# Patient Record
Sex: Female | Born: 1985 | Race: White | Hispanic: No | Marital: Single | Smoking: Never smoker
Health system: Southern US, Community
[De-identification: ages and names within clinical notes are randomized; demographics above are authoritative.]

## PROBLEM LIST (undated history)

## (undated) ENCOUNTER — Inpatient Hospital Stay (HOSPITAL_COMMUNITY): Payer: Self-pay

## (undated) DIAGNOSIS — N189 Chronic kidney disease, unspecified: Secondary | ICD-10-CM

## (undated) DIAGNOSIS — N200 Calculus of kidney: Secondary | ICD-10-CM

## (undated) DIAGNOSIS — E119 Type 2 diabetes mellitus without complications: Secondary | ICD-10-CM

## (undated) DIAGNOSIS — K8020 Calculus of gallbladder without cholecystitis without obstruction: Secondary | ICD-10-CM

## (undated) HISTORY — DX: Type 2 diabetes mellitus without complications: E11.9

## (undated) HISTORY — PX: OTHER SURGICAL HISTORY: SHX169

## (undated) HISTORY — PX: URETERAL STENT PLACEMENT: SHX822

---
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
## 2007-06-26 ENCOUNTER — Encounter: Payer: Self-pay | Admitting: Obstetrics and Gynecology

## 2007-06-26 ENCOUNTER — Ambulatory Visit (HOSPITAL_COMMUNITY): Admission: RE | Admit: 2007-06-26 | Discharge: 2007-06-26 | Payer: Self-pay | Admitting: Obstetrics & Gynecology

## 2007-06-26 ENCOUNTER — Ambulatory Visit: Payer: Self-pay | Admitting: Obstetrics & Gynecology

## 2007-07-03 ENCOUNTER — Ambulatory Visit: Payer: Self-pay | Admitting: Obstetrics & Gynecology

## 2007-07-05 ENCOUNTER — Ambulatory Visit: Payer: Self-pay | Admitting: Gynecology

## 2007-07-06 ENCOUNTER — Inpatient Hospital Stay (HOSPITAL_COMMUNITY): Admission: AD | Admit: 2007-07-06 | Discharge: 2007-07-09 | Payer: Self-pay | Admitting: Gynecology

## 2007-07-06 ENCOUNTER — Ambulatory Visit: Payer: Self-pay | Admitting: Obstetrics and Gynecology

## 2008-11-02 ENCOUNTER — Emergency Department (HOSPITAL_COMMUNITY): Admission: EM | Admit: 2008-11-02 | Discharge: 2008-11-02 | Payer: Self-pay | Admitting: Family Medicine

## 2009-03-18 ENCOUNTER — Inpatient Hospital Stay (HOSPITAL_COMMUNITY): Admission: AD | Admit: 2009-03-18 | Discharge: 2009-03-18 | Payer: Self-pay | Admitting: Family Medicine

## 2009-03-18 ENCOUNTER — Encounter: Payer: Self-pay | Admitting: Family Medicine

## 2009-03-31 ENCOUNTER — Ambulatory Visit: Payer: Self-pay | Admitting: Obstetrics and Gynecology

## 2009-03-31 LAB — CONVERTED CEMR LAB: Antibody Screen: NEGATIVE

## 2009-04-01 ENCOUNTER — Encounter: Payer: Self-pay | Admitting: Obstetrics and Gynecology

## 2009-04-07 ENCOUNTER — Ambulatory Visit: Payer: Self-pay | Admitting: Obstetrics and Gynecology

## 2009-04-07 LAB — CONVERTED CEMR LAB
Chlamydia, DNA Probe: POSITIVE — AB
GC Probe Amp, Genital: NEGATIVE

## 2009-04-08 ENCOUNTER — Ambulatory Visit: Payer: Self-pay | Admitting: Obstetrics & Gynecology

## 2009-04-14 ENCOUNTER — Encounter: Payer: Self-pay | Admitting: Obstetrics and Gynecology

## 2009-04-14 ENCOUNTER — Ambulatory Visit: Payer: Self-pay | Admitting: Obstetrics & Gynecology

## 2009-04-14 LAB — CONVERTED CEMR LAB
Chlamydia, DNA Probe: NEGATIVE
GC Probe Amp, Genital: NEGATIVE

## 2009-04-15 ENCOUNTER — Encounter: Payer: Self-pay | Admitting: Obstetrics and Gynecology

## 2009-04-16 ENCOUNTER — Ambulatory Visit: Payer: Self-pay | Admitting: Obstetrics & Gynecology

## 2009-04-21 ENCOUNTER — Ambulatory Visit (HOSPITAL_COMMUNITY): Admission: RE | Admit: 2009-04-21 | Discharge: 2009-04-21 | Payer: Self-pay | Admitting: Obstetrics and Gynecology

## 2009-04-21 ENCOUNTER — Ambulatory Visit: Payer: Self-pay | Admitting: Obstetrics and Gynecology

## 2009-04-28 ENCOUNTER — Ambulatory Visit: Payer: Self-pay | Admitting: Obstetrics and Gynecology

## 2009-04-30 ENCOUNTER — Ambulatory Visit: Payer: Self-pay | Admitting: Obstetrics and Gynecology

## 2009-05-02 ENCOUNTER — Inpatient Hospital Stay (HOSPITAL_COMMUNITY): Admission: RE | Admit: 2009-05-02 | Discharge: 2009-05-05 | Payer: Self-pay | Admitting: Obstetrics & Gynecology

## 2009-05-02 ENCOUNTER — Ambulatory Visit: Payer: Self-pay | Admitting: Family Medicine

## 2009-09-05 ENCOUNTER — Observation Stay (HOSPITAL_COMMUNITY): Admission: EM | Admit: 2009-09-05 | Discharge: 2009-09-05 | Payer: Self-pay | Admitting: Emergency Medicine

## 2009-09-16 ENCOUNTER — Emergency Department (HOSPITAL_COMMUNITY): Admission: EM | Admit: 2009-09-16 | Discharge: 2009-09-17 | Payer: Self-pay | Admitting: Emergency Medicine

## 2009-10-13 ENCOUNTER — Emergency Department (HOSPITAL_COMMUNITY): Admission: EM | Admit: 2009-10-13 | Discharge: 2009-10-13 | Payer: Self-pay | Admitting: Family Medicine

## 2010-06-04 LAB — URINE CULTURE
Colony Count: >=100000
Culture  Setup Time: 201107272020

## 2010-06-04 LAB — POCT URINALYSIS DIP (DEVICE)
Bilirubin Urine: NEGATIVE
Glucose, UA: NEGATIVE mg/dL
Ketones, ur: NEGATIVE mg/dL
Nitrite: POSITIVE — AB
Protein, ur: 100 mg/dL — AB
Specific Gravity, Urine: 1.025 (ref 1.005–1.030)
Urobilinogen, UA: 0.2 mg/dL (ref 0.0–1.0)
pH: 5.5 (ref 5.0–8.0)

## 2010-06-04 LAB — POCT PREGNANCY, URINE: Preg Test, Ur: NEGATIVE

## 2010-06-05 LAB — URINALYSIS, ROUTINE W REFLEX MICROSCOPIC
Bilirubin Urine: NEGATIVE
Bilirubin Urine: NEGATIVE
Glucose, UA: NEGATIVE mg/dL
Glucose, UA: NEGATIVE mg/dL
Hgb urine dipstick: NEGATIVE
Ketones, ur: NEGATIVE mg/dL
Ketones, ur: NEGATIVE mg/dL
Nitrite: NEGATIVE
Nitrite: POSITIVE — AB
Protein, ur: 30 mg/dL — AB
Protein, ur: NEGATIVE mg/dL
Specific Gravity, Urine: 1.019 (ref 1.005–1.030)
Specific Gravity, Urine: 1.004 — ABNORMAL LOW (ref 1.005–1.030)
Urobilinogen, UA: 0.2 mg/dL (ref 0.0–1.0)
Urobilinogen, UA: 0.2 mg/dL (ref 0.0–1.0)
pH: 8 (ref 5.0–8.0)
pH: 6.5 (ref 5.0–8.0)

## 2010-06-05 LAB — DIFFERENTIAL
Basophils Absolute: 0 10*3/uL (ref 0.0–0.1)
Basophils Relative: 0 % (ref 0–1)
Eosinophils Absolute: 0 10*3/uL (ref 0.0–0.7)
Eosinophils Relative: 0 % (ref 0–5)
Lymphocytes Relative: 8 % — ABNORMAL LOW (ref 12–46)
Lymphs Abs: 1.2 10*3/uL (ref 0.7–4.0)
Monocytes Absolute: 0.6 10*3/uL (ref 0.1–1.0)
Monocytes Relative: 4 % (ref 3–12)
Neutro Abs: 13.3 10*3/uL — ABNORMAL HIGH (ref 1.7–7.7)
Neutrophils Relative %: 88 % — ABNORMAL HIGH (ref 43–77)

## 2010-06-05 LAB — COMPREHENSIVE METABOLIC PANEL
ALT: 27 U/L (ref 0–35)
AST: 22 U/L (ref 0–37)
Albumin: 4 g/dL (ref 3.5–5.2)
Alkaline Phosphatase: 47 U/L (ref 39–117)
BUN: 11 mg/dL (ref 6–23)
CO2: 24 mEq/L (ref 19–32)
Calcium: 9.2 mg/dL (ref 8.4–10.5)
Chloride: 108 mEq/L (ref 96–112)
Creatinine, Ser: 1.23 mg/dL — ABNORMAL HIGH (ref 0.4–1.2)
GFR calc Af Amer: >60 mL/min (ref >60)
GFR calc non Af Amer: 55 mL/min — ABNORMAL LOW (ref >60)
Glucose, Bld: 132 mg/dL — ABNORMAL HIGH (ref 70–99)
Potassium: 3.6 mEq/L (ref 3.5–5.1)
Sodium: 140 mEq/L (ref 135–145)
Total Bilirubin: 0.4 mg/dL (ref 0.3–1.2)
Total Protein: 7.1 g/dL (ref 6.0–8.3)

## 2010-06-05 LAB — URINE CULTURE: Colony Count: >=100000

## 2010-06-05 LAB — CBC
HCT: 39.2 % (ref 36.0–46.0)
Hemoglobin: 13.2 g/dL (ref 12.0–15.0)
MCHC: 33.8 g/dL (ref 30.0–36.0)
MCV: 84.6 fL (ref 78.0–100.0)
Platelets: 209 10*3/uL (ref 150–400)
RBC: 4.63 MIL/uL (ref 3.87–5.11)
RDW: 13.9 % (ref 11.5–15.5)
WBC: 15.2 10*3/uL — ABNORMAL HIGH (ref 4.0–10.5)

## 2010-06-05 LAB — POCT URINALYSIS DIP (DEVICE)
Bilirubin Urine: NEGATIVE
Bilirubin Urine: NEGATIVE
Bilirubin Urine: NEGATIVE
Glucose, UA: NEGATIVE mg/dL
Glucose, UA: NEGATIVE mg/dL
Glucose, UA: NEGATIVE mg/dL
Ketones, ur: NEGATIVE mg/dL
Ketones, ur: NEGATIVE mg/dL
Ketones, ur: NEGATIVE mg/dL
Nitrite: POSITIVE — AB
Nitrite: NEGATIVE
Nitrite: NEGATIVE
Protein, ur: NEGATIVE mg/dL
Protein, ur: NEGATIVE mg/dL
Protein, ur: NEGATIVE mg/dL
Specific Gravity, Urine: 1.02 (ref 1.005–1.030)
Specific Gravity, Urine: 1.015 (ref 1.005–1.030)
Specific Gravity, Urine: 1.015 (ref 1.005–1.030)
Urobilinogen, UA: 0.2 mg/dL (ref 0.0–1.0)
Urobilinogen, UA: 0.2 mg/dL (ref 0.0–1.0)
Urobilinogen, UA: 0.2 mg/dL (ref 0.0–1.0)
pH: 6 (ref 5.0–8.0)
pH: 6.5 (ref 5.0–8.0)
pH: 6.5 (ref 5.0–8.0)

## 2010-06-05 LAB — POCT PREGNANCY, URINE: Preg Test, Ur: NEGATIVE

## 2010-06-05 LAB — URINE MICROSCOPIC-ADD ON

## 2010-06-05 LAB — LIPASE, BLOOD: Lipase: 37 U/L (ref 11–59)

## 2010-06-05 LAB — PREGNANCY, URINE: Preg Test, Ur: NEGATIVE

## 2010-06-08 ENCOUNTER — Inpatient Hospital Stay (INDEPENDENT_AMBULATORY_CARE_PROVIDER_SITE_OTHER)
Admission: RE | Admit: 2010-06-08 | Discharge: 2010-06-08 | Disposition: A | Discharge status: Home / Self Care | Payer: Medicaid Other | Source: Ambulatory Visit | Attending: Family Medicine | Admitting: Family Medicine

## 2010-06-08 DIAGNOSIS — M545 Low back pain, unspecified: Secondary | ICD-10-CM

## 2010-06-08 DIAGNOSIS — N390 Urinary tract infection, site not specified: Secondary | ICD-10-CM

## 2010-06-08 LAB — CBC
HCT: 30.8 % — ABNORMAL LOW (ref 36.0–46.0)
HCT: 34.2 % — ABNORMAL LOW (ref 36.0–46.0)
Hemoglobin: 10.3 g/dL — ABNORMAL LOW (ref 12.0–15.0)
Hemoglobin: 11.3 g/dL — ABNORMAL LOW (ref 12.0–15.0)
MCHC: 32.9 g/dL (ref 30.0–36.0)
MCHC: 33.3 g/dL (ref 30.0–36.0)
MCV: 82.7 fL (ref 78.0–100.0)
MCV: 83.5 fL (ref 78.0–100.0)
Platelets: 152 10*3/uL (ref 150–400)
Platelets: 182 10*3/uL (ref 150–400)
RBC: 3.69 MIL/uL — ABNORMAL LOW (ref 3.87–5.11)
RBC: 4.13 MIL/uL (ref 3.87–5.11)
RDW: 15.4 % (ref 11.5–15.5)
RDW: 15.5 % (ref 11.5–15.5)
WBC: 8.3 10*3/uL (ref 4.0–10.5)
WBC: 9.1 10*3/uL (ref 4.0–10.5)

## 2010-06-08 LAB — POCT URINALYSIS DIP (DEVICE)
Bilirubin Urine: NEGATIVE
Bilirubin Urine: NEGATIVE
Bilirubin Urine: NEGATIVE
Glucose, UA: NEGATIVE mg/dL
Glucose, UA: NEGATIVE mg/dL
Glucose, UA: NEGATIVE mg/dL
Ketones, ur: NEGATIVE mg/dL
Ketones, ur: NEGATIVE mg/dL
Ketones, ur: NEGATIVE mg/dL
Nitrite: NEGATIVE
Nitrite: NEGATIVE
Nitrite: NEGATIVE
Protein, ur: NEGATIVE mg/dL
Protein, ur: NEGATIVE mg/dL
Protein, ur: 100 mg/dL — AB
Specific Gravity, Urine: 1.01 (ref 1.005–1.030)
Specific Gravity, Urine: 1.015 (ref 1.005–1.030)
Specific Gravity, Urine: 1.02 (ref 1.005–1.030)
Urobilinogen, UA: 0.2 mg/dL (ref 0.0–1.0)
Urobilinogen, UA: 1 mg/dL (ref 0.0–1.0)
Urobilinogen, UA: 0.2 mg/dL (ref 0.0–1.0)
pH: 6 (ref 5.0–8.0)
pH: 6.5 (ref 5.0–8.0)
pH: 7 (ref 5.0–8.0)

## 2010-06-08 LAB — CCBB MATERNAL DONOR DRAW

## 2010-06-08 LAB — GLUCOSE, CAPILLARY: Glucose-Capillary: 84 mg/dL (ref 70–99)

## 2010-06-08 LAB — POCT PREGNANCY, URINE: Preg Test, Ur: NEGATIVE

## 2010-06-08 LAB — RPR: RPR Ser Ql: NONREACTIVE

## 2010-06-09 LAB — URINE CULTURE
Colony Count: 1000
Culture  Setup Time: 201203212212

## 2010-06-20 LAB — CBC
HCT: 33.1 % — ABNORMAL LOW (ref 36.0–46.0)
Hemoglobin: 11.1 g/dL — ABNORMAL LOW (ref 12.0–15.0)
MCHC: 33.5 g/dL (ref 30.0–36.0)
MCV: 84.1 fL (ref 78.0–100.0)
Platelets: 210 10*3/uL (ref 150–400)
RBC: 3.94 MIL/uL (ref 3.87–5.11)
RDW: 13.7 % (ref 11.5–15.5)
WBC: 9.7 10*3/uL (ref 4.0–10.5)

## 2010-06-20 LAB — DIFFERENTIAL
Basophils Absolute: 0 10*3/uL (ref 0.0–0.1)
Basophils Relative: 0 % (ref 0–1)
Eosinophils Absolute: 0.4 10*3/uL (ref 0.0–0.7)
Eosinophils Relative: 4 % (ref 0–5)
Lymphocytes Relative: 18 % (ref 12–46)
Lymphs Abs: 1.8 10*3/uL (ref 0.7–4.0)
Monocytes Absolute: 0.5 10*3/uL (ref 0.1–1.0)
Monocytes Relative: 6 % (ref 3–12)
Neutro Abs: 7 10*3/uL (ref 1.7–7.7)
Neutrophils Relative %: 72 % (ref 43–77)

## 2010-06-20 LAB — URINALYSIS, ROUTINE W REFLEX MICROSCOPIC
Bilirubin Urine: NEGATIVE
Glucose, UA: NEGATIVE mg/dL
Ketones, ur: 15 mg/dL — AB
Nitrite: POSITIVE — AB
Protein, ur: 30 mg/dL — AB
Specific Gravity, Urine: 1.02 (ref 1.005–1.030)
Urobilinogen, UA: 1 mg/dL (ref 0.0–1.0)
pH: 6.5 (ref 5.0–8.0)

## 2010-06-20 LAB — HEPATITIS B SURFACE ANTIGEN: Hepatitis B Surface Ag: NEGATIVE

## 2010-06-20 LAB — SICKLE CELL SCREEN: Sickle Cell Screen: NEGATIVE

## 2010-06-20 LAB — URINE MICROSCOPIC-ADD ON

## 2010-06-20 LAB — RAPID URINE DRUG SCREEN, HOSP PERFORMED
Amphetamines: NOT DETECTED
Barbiturates: NOT DETECTED
Benzodiazepines: NOT DETECTED
Cocaine: NOT DETECTED
Opiates: NOT DETECTED
Tetrahydrocannabinol: NOT DETECTED

## 2010-06-20 LAB — ABO/RH: ABO/RH(D): B POS

## 2010-06-20 LAB — HIV ANTIBODY (ROUTINE TESTING W REFLEX): HIV: NONREACTIVE

## 2010-06-20 LAB — RPR: RPR Ser Ql: NONREACTIVE

## 2010-06-20 LAB — RUBELLA SCREEN: Rubella: 19.4 IU/mL — ABNORMAL HIGH

## 2010-06-25 LAB — URINE CULTURE: Colony Count: >=100000

## 2010-06-25 LAB — POCT URINALYSIS DIP (DEVICE)
Bilirubin Urine: NEGATIVE
Glucose, UA: NEGATIVE mg/dL
Ketones, ur: NEGATIVE mg/dL
Nitrite: NEGATIVE
Protein, ur: 30 mg/dL — AB
Specific Gravity, Urine: 1.025 (ref 1.005–1.030)
Urobilinogen, UA: 0.2 mg/dL (ref 0.0–1.0)
pH: 5.5 (ref 5.0–8.0)

## 2010-06-25 LAB — POCT PREGNANCY, URINE: Preg Test, Ur: POSITIVE

## 2010-09-19 ENCOUNTER — Emergency Department (HOSPITAL_COMMUNITY)
Admission: EM | Admit: 2010-09-19 | Discharge: 2010-09-19 | Disposition: A | Discharge status: Home / Self Care | Payer: Medicaid Other | Attending: Emergency Medicine | Admitting: Emergency Medicine

## 2010-09-19 DIAGNOSIS — K089 Disorder of teeth and supporting structures, unspecified: Secondary | ICD-10-CM | POA: Insufficient documentation

## 2010-09-19 DIAGNOSIS — K029 Dental caries, unspecified: Secondary | ICD-10-CM | POA: Insufficient documentation

## 2010-09-23 ENCOUNTER — Emergency Department (HOSPITAL_COMMUNITY): Payer: Medicaid Other

## 2010-09-23 ENCOUNTER — Emergency Department (HOSPITAL_COMMUNITY)
Admission: EM | Admit: 2010-09-23 | Discharge: 2010-09-23 | Disposition: A | Discharge status: Home / Self Care | Payer: Medicaid Other | Attending: Emergency Medicine | Admitting: Emergency Medicine

## 2010-09-23 DIAGNOSIS — R109 Unspecified abdominal pain: Secondary | ICD-10-CM | POA: Insufficient documentation

## 2010-09-23 DIAGNOSIS — N390 Urinary tract infection, site not specified: Secondary | ICD-10-CM | POA: Insufficient documentation

## 2010-09-23 DIAGNOSIS — N201 Calculus of ureter: Secondary | ICD-10-CM | POA: Insufficient documentation

## 2010-09-23 LAB — CBC
HCT: 32.8 % — ABNORMAL LOW (ref 36.0–46.0)
Hemoglobin: 11.2 g/dL — ABNORMAL LOW (ref 12.0–15.0)
MCH: 28.3 pg (ref 26.0–34.0)
MCHC: 34.1 g/dL (ref 30.0–36.0)
MCV: 82.8 fL (ref 78.0–100.0)
Platelets: 158 10*3/uL (ref 150–400)
RBC: 3.96 MIL/uL (ref 3.87–5.11)
RDW: 13 % (ref 11.5–15.5)
WBC: 9.3 10*3/uL (ref 4.0–10.5)

## 2010-09-23 LAB — URINALYSIS, ROUTINE W REFLEX MICROSCOPIC
Glucose, UA: NEGATIVE mg/dL
Hgb urine dipstick: NEGATIVE
Ketones, ur: NEGATIVE mg/dL
Nitrite: NEGATIVE
Protein, ur: 30 mg/dL — AB
Specific Gravity, Urine: 1.031 — ABNORMAL HIGH (ref 1.005–1.030)
Urobilinogen, UA: 0.2 mg/dL (ref 0.0–1.0)
pH: 5.5 (ref 5.0–8.0)

## 2010-09-23 LAB — POCT I-STAT, CHEM 8
BUN: 13 mg/dL (ref 6–23)
Calcium, Ion: 1.19 mmol/L (ref 1.12–1.32)
Chloride: 103 mEq/L (ref 96–112)
Creatinine, Ser: 1 mg/dL (ref 0.50–1.10)
Glucose, Bld: 94 mg/dL (ref 70–99)
HCT: 36 % (ref 36.0–46.0)
Hemoglobin: 12.2 g/dL (ref 12.0–15.0)
Potassium: 3.2 mEq/L — ABNORMAL LOW (ref 3.5–5.1)
Sodium: 138 mEq/L (ref 135–145)
TCO2: 25 mmol/L (ref 0–100)

## 2010-09-23 LAB — DIFFERENTIAL
Basophils Absolute: 0 10*3/uL (ref 0.0–0.1)
Basophils Relative: 0 % (ref 0–1)
Eosinophils Absolute: 1.3 10*3/uL — ABNORMAL HIGH (ref 0.0–0.7)
Eosinophils Relative: 14 % — ABNORMAL HIGH (ref 0–5)
Lymphocytes Relative: 25 % (ref 12–46)
Lymphs Abs: 2.3 10*3/uL (ref 0.7–4.0)
Monocytes Absolute: 0.5 10*3/uL (ref 0.1–1.0)
Monocytes Relative: 6 % (ref 3–12)
Neutro Abs: 5.1 10*3/uL (ref 1.7–7.7)
Neutrophils Relative %: 55 % (ref 43–77)

## 2010-09-23 LAB — URINE MICROSCOPIC-ADD ON

## 2010-09-23 LAB — POCT PREGNANCY, URINE: Preg Test, Ur: NEGATIVE

## 2010-09-24 LAB — URINE CULTURE
Colony Count: 50000
Culture  Setup Time: 201207061144

## 2010-10-05 ENCOUNTER — Emergency Department (HOSPITAL_COMMUNITY): Payer: Medicaid Other

## 2010-10-05 ENCOUNTER — Emergency Department (HOSPITAL_COMMUNITY)
Admission: EM | Admit: 2010-10-05 | Discharge: 2010-10-05 | Disposition: A | Discharge status: Home / Self Care | Payer: Medicaid Other | Attending: Emergency Medicine | Admitting: Emergency Medicine

## 2010-10-05 DIAGNOSIS — R630 Anorexia: Secondary | ICD-10-CM | POA: Insufficient documentation

## 2010-10-05 DIAGNOSIS — R110 Nausea: Secondary | ICD-10-CM | POA: Insufficient documentation

## 2010-10-05 DIAGNOSIS — R109 Unspecified abdominal pain: Secondary | ICD-10-CM | POA: Insufficient documentation

## 2010-10-05 DIAGNOSIS — N200 Calculus of kidney: Secondary | ICD-10-CM | POA: Insufficient documentation

## 2010-10-05 LAB — BASIC METABOLIC PANEL
BUN: 13 mg/dL (ref 6–23)
CO2: 24 mEq/L (ref 19–32)
Calcium: 8.9 mg/dL (ref 8.4–10.5)
Chloride: 107 mEq/L (ref 96–112)
Creatinine, Ser: 0.86 mg/dL (ref 0.50–1.10)
GFR calc Af Amer: >60 mL/min (ref >60)
GFR calc non Af Amer: >60 mL/min (ref >60)
Glucose, Bld: 100 mg/dL — ABNORMAL HIGH (ref 70–99)
Potassium: 3.7 mEq/L (ref 3.5–5.1)
Sodium: 138 mEq/L (ref 135–145)

## 2010-10-05 LAB — DIFFERENTIAL
Basophils Absolute: 0 10*3/uL (ref 0.0–0.1)
Basophils Relative: 1 % (ref 0–1)
Eosinophils Absolute: 0.4 10*3/uL (ref 0.0–0.7)
Eosinophils Relative: 7 % — ABNORMAL HIGH (ref 0–5)
Lymphocytes Relative: 36 % (ref 12–46)
Lymphs Abs: 1.9 10*3/uL (ref 0.7–4.0)
Monocytes Absolute: 0.4 10*3/uL (ref 0.1–1.0)
Monocytes Relative: 7 % (ref 3–12)
Neutro Abs: 2.6 10*3/uL (ref 1.7–7.7)
Neutrophils Relative %: 50 % (ref 43–77)

## 2010-10-05 LAB — URINALYSIS, ROUTINE W REFLEX MICROSCOPIC
Bilirubin Urine: NEGATIVE
Glucose, UA: NEGATIVE mg/dL
Ketones, ur: NEGATIVE mg/dL
Leukocytes, UA: NEGATIVE
Nitrite: NEGATIVE
Protein, ur: 100 mg/dL — AB
Specific Gravity, Urine: 1.021 (ref 1.005–1.030)
Urobilinogen, UA: 0.2 mg/dL (ref 0.0–1.0)
pH: 6 (ref 5.0–8.0)

## 2010-10-05 LAB — URINE MICROSCOPIC-ADD ON

## 2010-10-05 LAB — CBC
HCT: 36.2 % (ref 36.0–46.0)
Hemoglobin: 12.1 g/dL (ref 12.0–15.0)
MCH: 27.4 pg (ref 26.0–34.0)
MCHC: 33.4 g/dL (ref 30.0–36.0)
MCV: 81.9 fL (ref 78.0–100.0)
Platelets: 220 10*3/uL (ref 150–400)
RBC: 4.42 MIL/uL (ref 3.87–5.11)
RDW: 13 % (ref 11.5–15.5)
WBC: 5.3 10*3/uL (ref 4.0–10.5)

## 2010-10-05 LAB — POCT PREGNANCY, URINE: Preg Test, Ur: NEGATIVE

## 2010-10-27 ENCOUNTER — Emergency Department (HOSPITAL_COMMUNITY)
Admission: EM | Admit: 2010-10-27 | Discharge: 2010-10-27 | Disposition: A | Discharge status: Home / Self Care | Payer: Self-pay | Attending: Emergency Medicine | Admitting: Emergency Medicine

## 2010-10-27 ENCOUNTER — Emergency Department (HOSPITAL_COMMUNITY): Payer: Self-pay

## 2010-10-27 DIAGNOSIS — N201 Calculus of ureter: Secondary | ICD-10-CM | POA: Insufficient documentation

## 2010-10-27 DIAGNOSIS — N1330 Unspecified hydronephrosis: Secondary | ICD-10-CM | POA: Insufficient documentation

## 2010-10-27 DIAGNOSIS — E669 Obesity, unspecified: Secondary | ICD-10-CM | POA: Insufficient documentation

## 2010-10-27 DIAGNOSIS — M545 Low back pain, unspecified: Secondary | ICD-10-CM | POA: Insufficient documentation

## 2010-10-27 DIAGNOSIS — R109 Unspecified abdominal pain: Secondary | ICD-10-CM | POA: Insufficient documentation

## 2010-10-27 LAB — URINE MICROSCOPIC-ADD ON

## 2010-10-27 LAB — BASIC METABOLIC PANEL
BUN: 12 mg/dL (ref 6–23)
CO2: 26 mEq/L (ref 19–32)
Calcium: 8.6 mg/dL (ref 8.4–10.5)
Chloride: 107 mEq/L (ref 96–112)
Creatinine, Ser: 0.84 mg/dL (ref 0.50–1.10)
GFR calc Af Amer: >60 mL/min (ref >60)
GFR calc non Af Amer: >60 mL/min (ref >60)
Glucose, Bld: 97 mg/dL (ref 70–99)
Potassium: 3.1 mEq/L — ABNORMAL LOW (ref 3.5–5.1)
Sodium: 139 mEq/L (ref 135–145)

## 2010-10-27 LAB — URINALYSIS, ROUTINE W REFLEX MICROSCOPIC
Bilirubin Urine: NEGATIVE
Glucose, UA: NEGATIVE mg/dL
Hgb urine dipstick: NEGATIVE
Ketones, ur: NEGATIVE mg/dL
Nitrite: NEGATIVE
Protein, ur: 100 mg/dL — AB
Specific Gravity, Urine: 1.02 (ref 1.005–1.030)
Urobilinogen, UA: 0.2 mg/dL (ref 0.0–1.0)
pH: 8.5 — ABNORMAL HIGH (ref 5.0–8.0)

## 2010-10-27 LAB — POCT PREGNANCY, URINE: Preg Test, Ur: NEGATIVE

## 2010-12-13 LAB — POCT URINALYSIS DIP (DEVICE)
Bilirubin Urine: NEGATIVE
Bilirubin Urine: NEGATIVE
Bilirubin Urine: NEGATIVE
Glucose, UA: NEGATIVE
Glucose, UA: 250 — AB
Glucose, UA: >=1000 — AB
Ketones, ur: NEGATIVE
Ketones, ur: NEGATIVE
Ketones, ur: NEGATIVE
Nitrite: NEGATIVE
Nitrite: NEGATIVE
Nitrite: NEGATIVE
Operator id: 297281
Operator id: 159681
Operator id: 297281
Protein, ur: 100 — AB
Protein, ur: 100 — AB
Protein, ur: 30 — AB
Specific Gravity, Urine: 1.015
Specific Gravity, Urine: 1.01
Specific Gravity, Urine: 1.02
Urobilinogen, UA: 0.2
Urobilinogen, UA: 0.2
Urobilinogen, UA: 0.2
pH: 6
pH: 6
pH: 6

## 2010-12-13 LAB — CBC
HCT: 29 — ABNORMAL LOW
HCT: 29.7 — ABNORMAL LOW
Hemoglobin: 9.6 — ABNORMAL LOW
Hemoglobin: 9.9 — ABNORMAL LOW
MCHC: 32.9
MCHC: 33.3
MCV: 71 — ABNORMAL LOW
MCV: 71.9 — ABNORMAL LOW
Platelets: 230
Platelets: 256
RBC: 4.04
RBC: 4.18
RDW: 16.3 — ABNORMAL HIGH
RDW: 16.5 — ABNORMAL HIGH
WBC: 13.8 — ABNORMAL HIGH
WBC: 9.8

## 2010-12-13 LAB — RPR: RPR Ser Ql: NONREACTIVE

## 2010-12-13 LAB — CCBB MATERNAL DONOR DRAW

## 2011-02-19 ENCOUNTER — Emergency Department (HOSPITAL_COMMUNITY)
Admission: EM | Admit: 2011-02-19 | Discharge: 2011-02-20 | Disposition: A | Discharge status: Home / Self Care | Payer: Self-pay | Attending: Emergency Medicine | Admitting: Emergency Medicine

## 2011-02-19 ENCOUNTER — Encounter: Payer: Self-pay | Admitting: *Deleted

## 2011-02-19 DIAGNOSIS — B9689 Other specified bacterial agents as the cause of diseases classified elsewhere: Secondary | ICD-10-CM | POA: Insufficient documentation

## 2011-02-19 DIAGNOSIS — O2390 Unspecified genitourinary tract infection in pregnancy, unspecified trimester: Secondary | ICD-10-CM | POA: Insufficient documentation

## 2011-02-19 DIAGNOSIS — Z331 Pregnant state, incidental: Secondary | ICD-10-CM

## 2011-02-19 DIAGNOSIS — N760 Acute vaginitis: Secondary | ICD-10-CM | POA: Insufficient documentation

## 2011-02-19 DIAGNOSIS — Z3201 Encounter for pregnancy test, result positive: Secondary | ICD-10-CM | POA: Insufficient documentation

## 2011-02-19 DIAGNOSIS — R109 Unspecified abdominal pain: Secondary | ICD-10-CM | POA: Insufficient documentation

## 2011-02-19 DIAGNOSIS — M549 Dorsalgia, unspecified: Secondary | ICD-10-CM | POA: Insufficient documentation

## 2011-02-19 DIAGNOSIS — N390 Urinary tract infection, site not specified: Secondary | ICD-10-CM | POA: Insufficient documentation

## 2011-02-19 DIAGNOSIS — A499 Bacterial infection, unspecified: Secondary | ICD-10-CM | POA: Insufficient documentation

## 2011-02-19 HISTORY — DX: Calculus of kidney: N20.0

## 2011-02-19 LAB — URINE MICROSCOPIC-ADD ON

## 2011-02-19 LAB — URINALYSIS, ROUTINE W REFLEX MICROSCOPIC
Bilirubin Urine: NEGATIVE
Glucose, UA: NEGATIVE mg/dL
Hgb urine dipstick: NEGATIVE
Ketones, ur: NEGATIVE mg/dL
Nitrite: NEGATIVE
Protein, ur: NEGATIVE mg/dL
Specific Gravity, Urine: 1.015 (ref 1.005–1.030)
Urobilinogen, UA: 1 mg/dL (ref 0.0–1.0)
pH: 6.5 (ref 5.0–8.0)

## 2011-02-19 LAB — POCT PREGNANCY, URINE: Preg Test, Ur: POSITIVE

## 2011-02-19 NOTE — ED Notes (Signed)
Patient with back pain radiating to her shoulders.  States that pain started to get worse around 9pm

## 2011-02-20 ENCOUNTER — Emergency Department (HOSPITAL_COMMUNITY): Payer: Self-pay

## 2011-02-20 LAB — COMPREHENSIVE METABOLIC PANEL
ALT: 12 U/L (ref 0–35)
AST: 11 U/L (ref 0–37)
Albumin: 3.3 g/dL — ABNORMAL LOW (ref 3.5–5.2)
Alkaline Phosphatase: 38 U/L — ABNORMAL LOW (ref 39–117)
BUN: 11 mg/dL (ref 6–23)
CO2: 24 mEq/L (ref 19–32)
Calcium: 9 mg/dL (ref 8.4–10.5)
Chloride: 105 mEq/L (ref 96–112)
Creatinine, Ser: 0.87 mg/dL (ref 0.50–1.10)
GFR calc Af Amer: >90 mL/min (ref >90)
GFR calc non Af Amer: >90 mL/min (ref >90)
Glucose, Bld: 90 mg/dL (ref 70–99)
Potassium: 3.4 mEq/L — ABNORMAL LOW (ref 3.5–5.1)
Sodium: 138 mEq/L (ref 135–145)
Total Bilirubin: 0.2 mg/dL — ABNORMAL LOW (ref 0.3–1.2)
Total Protein: 6.4 g/dL (ref 6.0–8.3)

## 2011-02-20 LAB — DIFFERENTIAL
Basophils Absolute: 0 10*3/uL (ref 0.0–0.1)
Basophils Relative: 0 % (ref 0–1)
Eosinophils Absolute: 1.1 10*3/uL — ABNORMAL HIGH (ref 0.0–0.7)
Eosinophils Relative: 14 % — ABNORMAL HIGH (ref 0–5)
Lymphocytes Relative: 27 % (ref 12–46)
Lymphs Abs: 2.2 10*3/uL (ref 0.7–4.0)
Monocytes Absolute: 0.6 10*3/uL (ref 0.1–1.0)
Monocytes Relative: 7 % (ref 3–12)
Neutro Abs: 4.2 10*3/uL (ref 1.7–7.7)
Neutrophils Relative %: 52 % (ref 43–77)

## 2011-02-20 LAB — CBC
HCT: 35.4 % — ABNORMAL LOW (ref 36.0–46.0)
Hemoglobin: 11.9 g/dL — ABNORMAL LOW (ref 12.0–15.0)
MCH: 28.1 pg (ref 26.0–34.0)
MCHC: 33.6 g/dL (ref 30.0–36.0)
MCV: 83.7 fL (ref 78.0–100.0)
Platelets: 174 10*3/uL (ref 150–400)
RBC: 4.23 MIL/uL (ref 3.87–5.11)
RDW: 13.5 % (ref 11.5–15.5)
WBC: 8.1 10*3/uL (ref 4.0–10.5)

## 2011-02-20 LAB — WET PREP, GENITAL
Trich, Wet Prep: NONE SEEN
Yeast Wet Prep HPF POC: NONE SEEN

## 2011-02-20 LAB — HCG, QUANTITATIVE, PREGNANCY: hCG, Beta Chain, Quant, S: 15445 m[IU]/mL — ABNORMAL HIGH (ref <5)

## 2011-02-20 MED ORDER — SODIUM CHLORIDE 0.9 % IV SOLN
Freq: Once | INTRAVENOUS | Status: AC
  Administered 2011-02-20: 08:00:00 via INTRAVENOUS

## 2011-02-20 MED ORDER — CEPHALEXIN 500 MG PO CAPS: 500.0000 mg | ORAL_CAPSULE | Freq: Three times a day (TID) | ORAL | Status: AC

## 2011-02-20 MED ORDER — METRONIDAZOLE 0.75 % VA GEL: VAGINAL | Status: DC

## 2011-02-20 MED ORDER — ONDANSETRON HCL 4 MG/2ML IJ SOLN
4.0000 mg | Freq: Once | INTRAMUSCULAR | Status: AC
  Administered 2011-02-20: 4 mg via INTRAVENOUS
  Filled 2011-02-20: qty 2

## 2011-02-20 MED ORDER — MORPHINE SULFATE 2 MG/ML IJ SOLN
2.0000 mg | Freq: Once | INTRAMUSCULAR | Status: AC
  Administered 2011-02-20: 2 mg via INTRAVENOUS
  Filled 2011-02-20: qty 1

## 2011-02-20 MED ORDER — PRENATAL COMPLETE 14-0.4 MG PO TABS: 1.0000 | ORAL_TABLET | Freq: Every day | ORAL | Status: DC

## 2011-02-20 NOTE — ED Provider Notes (Signed)
She was left to me at the end of shift to get MRI of her abdomen. Patient is [redacted] weeks pregnant per ultrasound done today. He is been having right lower and upper abdominal pain. Her MRI shows gallstones without cholecystitis. It shows her IUP without significant t pelvic abnormality. Also her appendix was normal. Reviewing her prior laboratory tests today should her urine shows a UTI and she also has bacterial vaginosis  She is sleeping she is given laboratory and radiology results, she's will be discharged to take medications , as typically antibiotics. She states that since that family practice did her last pregnancy she is advised to seek prenatal care soon. She also was advised if she has worsening pain bleeding she should go to [facility] for further evaluation.  Ward Givens, MD 02/20/11 1143

## 2011-02-20 NOTE — ED Notes (Signed)
Patient with lower back pain and pain into right flank.  Patient denies any nausea or vomiting at this time.  Patient states she took a pregnancy test one week ago.  Now with increased back pain.

## 2011-02-20 NOTE — ED Provider Notes (Signed)
History     CSN: [id number] Arrival date & time: 02/19/2011 10:58 PM   First MD Initiated Contact with Patient 02/20/11 0017      Chief Complaint  Patient presents with  . Back Pain    (Consider location/radiation/quality/duration/timing/severity/associated sxs/prior treatment) Patient is a 25 y.o. female presenting with back pain and flank pain. The history is provided by the patient. No language interpreter was used.  Back Pain  Associated symptoms include abdominal pain. Pertinent negatives include no chest pain and no fever.  Flank Pain This is a new problem. The current episode started today. The problem occurs constantly. The problem has been gradually worsening. Associated symptoms include abdominal pain. Pertinent negatives include no chest pain, chills, diaphoresis, fever, nausea or neck pain. She has tried lying down and acetaminophen for the symptoms. The treatment provided moderate relief.  Flank Pain This is a new problem. The current episode started today. The problem occurs constantly. The problem has been gradually worsening. Associated symptoms include abdominal pain. Pertinent negatives include no chest pain. She has tried lying down and acetaminophen for the symptoms. The treatment provided moderate relief.   Patient states that she lay down to bed tonight and started having right lower quadrant pain radiated to her right flank and into her back between her shoulder blades. They said she took a positive pregnancy test last week. States that her last menstrual period was supposed to start on November 19. If that she's had the right flank pain x2 days she has a history of kidney stones and thought that the pain was coming from. Denies knowledge nausea vomiting or diarrhea denies vaginal discharge or odor bleeding. Bedside ultrasound reveals kidney stones on the right. He does have a positive pregnancy in the ER today. Past Medical History  Diagnosis Date  . Kidney stones      History reviewed. No pertinent past surgical history.  No family history on file.  History  Substance Use Topics  . Smoking status: Never Smoker   . Smokeless tobacco: Not on file  . Alcohol Use: Yes    OB History    Grav Para Term Preterm Abortions TAB SAB Ect Mult Living                  Review of Systems  Constitutional: Negative for fever, chills and diaphoresis.  HENT: Negative for neck pain.   Cardiovascular: Negative for chest pain.  Gastrointestinal: Positive for abdominal pain. Negative for nausea.  Genitourinary: Positive for flank pain.  Musculoskeletal: Positive for back pain.  All other systems reviewed and are negative.    Allergies  Review of patient's allergies indicates no known allergies.  Home Medications   Current Outpatient Rx  Name Route Sig Dispense Refill  . ACETAMINOPHEN 500 MG PO TABS Oral Take 1,000 mg by mouth every 6 (six) hours as needed. For pain     . IBUPROFEN 200 MG PO TABS Oral Take 400 mg by mouth every 6 (six) hours as needed. For pain       BP 109/86  Pulse 114  Temp(Src) 98.3 F (36.8 C) (Oral)  Resp 18  SpO2 97%  Physical Exam  Nursing note and vitals reviewed. Constitutional: She is oriented to person, place, and time. She appears well-developed and well-nourished.  HENT:  Head: Normocephalic.  Eyes: Pupils are equal, round, and reactive to light.  Neck: Normal range of motion.  Cardiovascular: Normal rate.   Pulmonary/Chest: Effort normal.  Abdominal: Soft. Bowel sounds are normal.  She exhibits no distension and no mass. There is tenderness. There is no rebound and no guarding.  Genitourinary: Cervix exhibits discharge. Cervix exhibits no motion tenderness and no friability. Right adnexum displays no mass, no tenderness and no fullness. Left adnexum displays no mass, no tenderness and no fullness. No erythema, tenderness or bleeding around the vagina. No foreign body around the vagina. Vaginal discharge found.   Musculoskeletal:       Pain radiating from R flank to between shoulder blades tender with palpatation  Neurological: She is alert and oriented to person, place, and time.  Skin: Skin is warm and dry.  Psychiatric: She has a normal mood and affect.    ED Course  Procedures (including critical care time)  Results for orders placed during the hospital encounter of 02/19/11  URINALYSIS, ROUTINE W REFLEX MICROSCOPIC      Component Value Range   Color, Urine YELLOW  YELLOW    APPearance CLOUDY (*) CLEAR    Specific Gravity, Urine 1.015  1.005 - 1.030    pH 6.5  5.0 - 8.0    Glucose, UA NEGATIVE  NEGATIVE (mg/dL)   Hgb urine dipstick NEGATIVE  NEGATIVE    Bilirubin Urine NEGATIVE  NEGATIVE    Ketones, ur NEGATIVE  NEGATIVE (mg/dL)   Protein, ur NEGATIVE  NEGATIVE (mg/dL)   Urobilinogen, UA 1.0  0.0 - 1.0 (mg/dL)   Nitrite NEGATIVE  NEGATIVE    Leukocytes, UA MODERATE (*) NEGATIVE   POCT PREGNANCY, URINE      Component Value Range   Preg Test, Ur POSITIVE    URINE MICROSCOPIC-ADD ON      Component Value Range   Squamous Epithelial / LPF RARE  RARE    WBC, UA 21-50  <3 (WBC/hpf)   RBC / HPF 0-2  <3 (RBC/hpf)   Bacteria, UA RARE  RARE   CBC      Component Value Range   WBC 8.1  4.0 - 10.5 (K/uL)   RBC 4.23  3.87 - 5.11 (MIL/uL)   Hemoglobin 11.9 (*) 12.0 - 15.0 (g/dL)   HCT 86.5 (*) 78.4 - 46.0 (%)   MCV 83.7  78.0 - 100.0 (fL)   MCH 28.1  26.0 - 34.0 (pg)   MCHC 33.6  30.0 - 36.0 (g/dL)   RDW 69.6  29.5 - 28.4 (%)   Platelets 174  150 - 400 (K/uL)  DIFFERENTIAL      Component Value Range   Neutrophils Relative 52  43 - 77 (%)   Neutro Abs 4.2  1.7 - 7.7 (K/uL)   Lymphocytes Relative 27  12 - 46 (%)   Lymphs Abs 2.2  0.7 - 4.0 (K/uL)   Monocytes Relative 7  3 - 12 (%)   Monocytes Absolute 0.6  0.1 - 1.0 (K/uL)   Eosinophils Relative 14 (*) 0 - 5 (%)   Eosinophils Absolute 1.1 (*) 0.0 - 0.7 (K/uL)   Basophils Relative 0  0 - 1 (%)   Basophils Absolute 0.0  0.0 - 0.1  (K/uL)  HCG, QUANTITATIVE, PREGNANCY      Component Value Range   hCG, Beta Chain, Quant, S 15445 (*) <5 (mIU/mL)  COMPREHENSIVE METABOLIC PANEL      Component Value Range   Sodium 138  135 - 145 (mEq/L)   Potassium 3.4 (*) 3.5 - 5.1 (mEq/L)   Chloride 105  96 - 112 (mEq/L)   CO2 24  19 - 32 (mEq/L)   Glucose, Bld 90  70 -  99 (mg/dL)   BUN 11  6 - 23 (mg/dL)   Creatinine, Ser 1.61  0.50 - 1.10 (mg/dL)   Calcium 9.0  8.4 - 09.6 (mg/dL)   Total Protein 6.4  6.0 - 8.3 (g/dL)   Albumin 3.3 (*) 3.5 - 5.2 (g/dL)   AST 11  0 - 37 (U/L)   ALT 12  0 - 35 (U/L)   Alkaline Phosphatase 38 (*) 39 - 117 (U/L)   Total Bilirubin 0.2 (*) 0.3 - 1.2 (mg/dL)   GFR calc non Af Amer >90  >90 (mL/min)   GFR calc Af Amer >90  >90 (mL/min)  WET PREP, GENITAL      Component Value Range   Yeast, Wet Prep NONE SEEN  NONE SEEN    Trich, Wet Prep NONE SEEN  NONE SEEN    Clue Cells, Wet Prep MODERATE (*) NONE SEEN    WBC, Wet Prep HPF POC FEW (*) NONE SEEN    US Abdomen Complete  02/20/2011  *RADIOLOGY REPORT*  Clinical Data:  Abdominal pain; history of right renal stone.  ABDOMINAL ULTRASOUND COMPLETE  Comparison:  CT of the abdomen pelvis performed 10/27/2010, and abdominal ultrasound performed 09/05/2009  Findings:  Gallbladder:  A wall echo shadow sign is noted, compatible with a gallbladder filled with stones.  No definite gallbladder wall thickening is seen.  There is no evidence for obstruction or cholecystitis.  No ultrasonographic Murphy's sign is elicited.  Common Bile Duct:  0.4 cm in diameter; within normal limits in caliber.  Liver:  Normal parenchymal echogenicity and echotexture; no focal lesions identified.  Limited Doppler evaluation demonstrates normal blood flow within the liver.  IVC:  Unremarkable in appearance.  Pancreas:  Although the pancreas is difficult to visualize in its entirety due to overlying bowel gas, no focal pancreatic abnormality is identified.  Spleen:  12.7 cm in length; within  normal limits in size and echotexture.  Right kidney:  12.7 cm in length; normal in size and parenchymal echogenicity. Incomplete rotation of the right kidney is again noted.  Pelvicaliectasis is noted, without significant hydronephrosis.  Previously noted hydronephrosis has resolved; no definite renal stones are seen.  No evidence of mass.  Left kidney:  12.8 cm in length; borderline normal in size, configuration and parenchymal echogenicity.  No evidence of mass or hydronephrosis.  Abdominal Aorta:  Normal in caliber; no aneurysm identified.   IMPRESSION:  1.  Wall echo shadow sign, compatible with a gallbladder filled with stones.  No evidence for obstruction or cholecystitis. 2.  Right-sided renal pelvicaliectasis is likely within normal limits, without evidence of hydronephrosis; no definite renal stones now seen.  Original Report Authenticated By: Tonia Ghent, M.D.   US Ob Comp Less 14 Wks  02/20/2011  *RADIOLOGY REPORT*  Clinical Data: Right-sided abdominal pain.  OBSTETRIC <14 WK [place] AND TRANSVAGINAL OB US  Technique:  Both transabdominal and transvaginal ultrasound examinations were performed for complete evaluation of the gestation as well as the maternal uterus, adnexal regions, and pelvic cul-de-sac.  Transvaginal technique was performed to assess early pregnancy.  Comparison:  Prior CT of the abdomen and pelvis performed 10/27/2010, and pelvic ultrasound performed 04/21/2009  Intrauterine gestational sac:  Visualized/normal in shape. Yolk sac: Yes Embryo: Yes Cardiac Activity: Yes Heart Rate: 130 bpm  CRL: 3.6   mm  6   w  0   d         [place] EDC: 10/16/2011  Maternal uterus/adnexae: No subchorionic hemorrhage is seen.  The uterus is otherwise unremarkable in appearance.  The ovaries are within normal limits.  The right ovary measures 3.2 x 2.7 x 3.1 cm, while the left ovary measures 3.0 x 2.3 x 2.3 cm.  A moderate amount of free fluid is noted within the pelvis, likely physiologic in nature, as it  appears to favor the cul-de-sac and left side.  Limited evaluation of the right lower quadrant suggests a grossly unremarkable appendix, though it is not well seen.  IMPRESSION:  1.  Single live intrauterine pregnancy, with a crown-rump length of 3.6 mm, corresponding to a gestational age of [redacted] weeks 0 days. This does not match the gestational age by LMP, and reflects a new estimated date of delivery of October 16, 2011. 2.  Moderate amount of free fluid within the pelvis is likely physiologic in nature, as it appears to favor the cul-de-sac and left side.  Original Report Authenticated By: Tonia Ghent, M.D.   US Ob Transvaginal  02/20/2011  *RADIOLOGY REPORT*  Clinical Data: Right-sided abdominal pain.  OBSTETRIC <14 WK [place] AND TRANSVAGINAL OB US  Technique:  Both transabdominal and transvaginal ultrasound examinations were performed for complete evaluation of the gestation as well as the maternal uterus, adnexal regions, and pelvic cul-de-sac.  Transvaginal technique was performed to assess early pregnancy.  Comparison:  Prior CT of the abdomen and pelvis performed 10/27/2010, and pelvic ultrasound performed 04/21/2009  Intrauterine gestational sac:  Visualized/normal in shape. Yolk sac: Yes Embryo: Yes Cardiac Activity: Yes Heart Rate: 130 bpm  CRL: 3.6   mm  6   w  0   d         [place] EDC: 10/16/2011  Maternal uterus/adnexae: No subchorionic hemorrhage is seen.  The uterus is otherwise unremarkable in appearance.  The ovaries are within normal limits.  The right ovary measures 3.2 x 2.7 x 3.1 cm, while the left ovary measures 3.0 x 2.3 x 2.3 cm.  A moderate amount of free fluid is noted within the pelvis, likely physiologic in nature, as it appears to favor the cul-de-sac and left side.  Limited evaluation of the right lower quadrant suggests a grossly unremarkable appendix, though it is not well seen.    IMPRESSION:  1.  Single live intrauterine pregnancy, with a crown-rump length of 3.6 mm, corresponding to a  gestational age of [redacted] weeks 0 days. This does not match the gestational age by LMP, and reflects a new estimated date of delivery of October 16, 2011. 2.  Moderate amount of free fluid within the pelvis is likely physiologic in nature, as it appears to favor the cul-de-sac and left side.  Original Report Authenticated By: Tonia Ghent, M.D.       MDM  Positive pregnancy test in the ER.  R flank pain x 2 days with RLQ pain that radiates between shoulder blades since 9pm when she layed down for bed tonight.  PMH of Kidney stones.  No prior eptopics.  Has 4 kids with no complications.  Pelvic shows some discharge with closed OS.  Bedside US per Dr. Dierdre Highman shows kidney stones on the R.  Vaginal and abdominal U/S  pending to r/o gallbladder, ectopic or kidney stone causing the pain. Report given to Dr. Dierdre Highman.   Labs Reviewed  URINALYSIS, ROUTINE W REFLEX MICROSCOPIC - Abnormal; Notable for the following:    APPearance CLOUDY (*)    Leukocytes, UA MODERATE (*)    All other components within normal limits  CBC - Abnormal; Notable  for the following:    Hemoglobin 11.9 (*)    HCT 35.4 (*)    All other components within normal limits  DIFFERENTIAL - Abnormal; Notable for the following:    Eosinophils Relative 14 (*)    Eosinophils Absolute 1.1 (*)    All other components within normal limits  COMPREHENSIVE METABOLIC PANEL - Abnormal; Notable for the following:    Potassium 3.4 (*)    Albumin 3.3 (*)    Alkaline Phosphatase 38 (*)    Total Bilirubin 0.2 (*)    All other components within normal limits  POCT PREGNANCY, URINE  URINE MICROSCOPIC-ADD ON  POCT PREGNANCY, URINE  HCG, QUANTITATIVE, PREGNANCY  GC/CHLAMYDIA PROBE AMP, GENITAL  WET PREP, GENITAL            Jethro Bastos, NP 02/20/11 0127  Ultrasounds reviewed how 6 week IUP. Patient also has gallstones. On exam negative Murphy's sign and no peritonitis. She localizes pain to right abdomen and radiates right lower quadrant.  Also no ectopic on ultrasound. Case discussed as above with Dr. Dwain Sarna and will plan MRI protocol to evaluate appendix. Case also discussed with radiologist, will get MRI without contrast. PT care Tx to IK at 0745am pending MR ABD. Will keep NPO.   Sunnie Nielsen, MD 02/20/11 [phone]

## 2011-02-20 NOTE — ED Notes (Signed)
Patient transported to MRI 

## 2011-02-21 LAB — GC/CHLAMYDIA PROBE AMP, GENITAL
Chlamydia, DNA Probe: NEGATIVE
GC Probe Amp, Genital: NEGATIVE

## 2011-03-21 NOTE — L&D Delivery Note (Signed)
Delivery Note At 10:24 AM a viable and healthy female was delivered via Vaginal, Spontaneous Delivery (Presentation: right occiput anterior  ).  Upon delivery, occiput turned to left, as body was facing the right, to deliver the shoulders and body.  APGAR: pending ; weight pending.   Placenta status: intact, spontaneous.  Cord: 3 vessels with the following complications: nuchal x1.  Moderate meconium.  Anesthesia: Other  Episiotomy: none Lacerations: none Suture Repair: n/a Est. Blood Loss (mL): 300  Mom to postpartum.  Baby to nursery-stable.  Marikay Alar 10/20/2011, 10:34 AM

## 2011-03-21 NOTE — L&D Delivery Note (Signed)
I supervised Dr Birdie Sons during the delivery of this patient.  Levie Heritage, DO 10/20/2011 1:02 PM

## 2011-05-12 ENCOUNTER — Observation Stay (HOSPITAL_COMMUNITY)
Admission: AD | Admit: 2011-05-12 | Discharge: 2011-05-13 | Disposition: A | Discharge status: Home / Self Care | Payer: Self-pay | Attending: Obstetrics & Gynecology | Admitting: Obstetrics & Gynecology

## 2011-05-12 ENCOUNTER — Inpatient Hospital Stay (HOSPITAL_COMMUNITY): Payer: Self-pay

## 2011-05-12 ENCOUNTER — Encounter (HOSPITAL_COMMUNITY): Payer: Self-pay | Admitting: *Deleted

## 2011-05-12 DIAGNOSIS — R109 Unspecified abdominal pain: Secondary | ICD-10-CM | POA: Insufficient documentation

## 2011-05-12 DIAGNOSIS — K8020 Calculus of gallbladder without cholecystitis without obstruction: Secondary | ICD-10-CM | POA: Insufficient documentation

## 2011-05-12 DIAGNOSIS — N1330 Unspecified hydronephrosis: Secondary | ICD-10-CM | POA: Insufficient documentation

## 2011-05-12 DIAGNOSIS — O26839 Pregnancy related renal disease, unspecified trimester: Secondary | ICD-10-CM

## 2011-05-12 DIAGNOSIS — N200 Calculus of kidney: Secondary | ICD-10-CM | POA: Insufficient documentation

## 2011-05-12 DIAGNOSIS — Q638 Other specified congenital malformations of kidney: Secondary | ICD-10-CM | POA: Insufficient documentation

## 2011-05-12 DIAGNOSIS — O9989 Other specified diseases and conditions complicating pregnancy, childbirth and the puerperium: Secondary | ICD-10-CM | POA: Insufficient documentation

## 2011-05-12 DIAGNOSIS — O26899 Other specified pregnancy related conditions, unspecified trimester: Secondary | ICD-10-CM

## 2011-05-12 HISTORY — DX: Calculus of gallbladder without cholecystitis without obstruction: K80.20

## 2011-05-12 LAB — CBC
HCT: 33.7 % — ABNORMAL LOW (ref 36.0–46.0)
HCT: 31.3 % — ABNORMAL LOW (ref 36.0–46.0)
HCT: 30.2 % — ABNORMAL LOW (ref 36.0–46.0)
Hemoglobin: 11.4 g/dL — ABNORMAL LOW (ref 12.0–15.0)
Hemoglobin: 10.5 g/dL — ABNORMAL LOW (ref 12.0–15.0)
Hemoglobin: 10 g/dL — ABNORMAL LOW (ref 12.0–15.0)
MCH: 28.4 pg (ref 26.0–34.0)
MCH: 28.2 pg (ref 26.0–34.0)
MCH: 28.1 pg (ref 26.0–34.0)
MCHC: 33.8 g/dL (ref 30.0–36.0)
MCHC: 33.5 g/dL (ref 30.0–36.0)
MCHC: 33.1 g/dL (ref 30.0–36.0)
MCV: 83.8 fL (ref 78.0–100.0)
MCV: 83.9 fL (ref 78.0–100.0)
MCV: 84.8 fL (ref 78.0–100.0)
Platelets: 167 10*3/uL (ref 150–400)
Platelets: 165 10*3/uL (ref 150–400)
Platelets: 129 10*3/uL — ABNORMAL LOW (ref 150–400)
RBC: 4.02 MIL/uL (ref 3.87–5.11)
RBC: 3.73 MIL/uL — ABNORMAL LOW (ref 3.87–5.11)
RBC: 3.56 MIL/uL — ABNORMAL LOW (ref 3.87–5.11)
RDW: 13.6 % (ref 11.5–15.5)
RDW: 14 % (ref 11.5–15.5)
RDW: 13.8 % (ref 11.5–15.5)
WBC: 13.9 10*3/uL — ABNORMAL HIGH (ref 4.0–10.5)
WBC: 16.1 10*3/uL — ABNORMAL HIGH (ref 4.0–10.5)
WBC: 9.1 10*3/uL (ref 4.0–10.5)

## 2011-05-12 LAB — DIFFERENTIAL
Basophils Absolute: 0 10*3/uL (ref 0.0–0.1)
Basophils Absolute: 0 10*3/uL (ref 0.0–0.1)
Basophils Relative: 0 % (ref 0–1)
Basophils Relative: 0 % (ref 0–1)
Eosinophils Absolute: 0.1 10*3/uL (ref 0.0–0.7)
Eosinophils Absolute: 0.2 10*3/uL (ref 0.0–0.7)
Eosinophils Relative: 1 % (ref 0–5)
Eosinophils Relative: 3 % (ref 0–5)
Lymphocytes Relative: 7 % — ABNORMAL LOW (ref 12–46)
Lymphocytes Relative: 10 % — ABNORMAL LOW (ref 12–46)
Lymphs Abs: 1.1 10*3/uL (ref 0.7–4.0)
Lymphs Abs: 0.9 10*3/uL (ref 0.7–4.0)
Monocytes Absolute: 0.7 10*3/uL (ref 0.1–1.0)
Monocytes Absolute: 0.6 10*3/uL (ref 0.1–1.0)
Monocytes Relative: 5 % (ref 3–12)
Monocytes Relative: 6 % (ref 3–12)
Neutro Abs: 14.2 10*3/uL — ABNORMAL HIGH (ref 1.7–7.7)
Neutro Abs: 7.3 10*3/uL (ref 1.7–7.7)
Neutrophils Relative %: 88 % — ABNORMAL HIGH (ref 43–77)
Neutrophils Relative %: 81 % — ABNORMAL HIGH (ref 43–77)

## 2011-05-12 LAB — BASIC METABOLIC PANEL
BUN: 5 mg/dL — ABNORMAL LOW (ref 6–23)
CO2: 25 mEq/L (ref 19–32)
Calcium: 8.4 mg/dL (ref 8.4–10.5)
Chloride: 106 mEq/L (ref 96–112)
Creatinine, Ser: 0.58 mg/dL (ref 0.50–1.10)
GFR calc Af Amer: >90 mL/min (ref >90)
GFR calc non Af Amer: >90 mL/min (ref >90)
Glucose, Bld: 102 mg/dL — ABNORMAL HIGH (ref 70–99)
Potassium: 3.2 mEq/L — ABNORMAL LOW (ref 3.5–5.1)
Sodium: 136 mEq/L (ref 135–145)

## 2011-05-12 LAB — URINALYSIS, ROUTINE W REFLEX MICROSCOPIC
Bilirubin Urine: NEGATIVE
Glucose, UA: NEGATIVE mg/dL
Hgb urine dipstick: NEGATIVE
Ketones, ur: NEGATIVE mg/dL
Leukocytes, UA: NEGATIVE
Nitrite: NEGATIVE
Protein, ur: NEGATIVE mg/dL
Specific Gravity, Urine: 1.015 (ref 1.005–1.030)
Urobilinogen, UA: 1 mg/dL (ref 0.0–1.0)
pH: 7.5 (ref 5.0–8.0)

## 2011-05-12 LAB — COMPREHENSIVE METABOLIC PANEL
ALT: 9 U/L (ref 0–35)
AST: 12 U/L (ref 0–37)
Albumin: 2.8 g/dL — ABNORMAL LOW (ref 3.5–5.2)
Alkaline Phosphatase: 50 U/L (ref 39–117)
BUN: 8 mg/dL (ref 6–23)
CO2: 22 mEq/L (ref 19–32)
Calcium: 8.9 mg/dL (ref 8.4–10.5)
Chloride: 101 mEq/L (ref 96–112)
Creatinine, Ser: 0.82 mg/dL (ref 0.50–1.10)
GFR calc Af Amer: >90 mL/min (ref >90)
GFR calc non Af Amer: >90 mL/min (ref >90)
Glucose, Bld: 142 mg/dL — ABNORMAL HIGH (ref 70–99)
Potassium: 3.5 mEq/L (ref 3.5–5.1)
Sodium: 132 mEq/L — ABNORMAL LOW (ref 135–145)
Total Bilirubin: 0.3 mg/dL (ref 0.3–1.2)
Total Protein: 6.6 g/dL (ref 6.0–8.3)

## 2011-05-12 LAB — RAPID URINE DRUG SCREEN, HOSP PERFORMED
Amphetamines: NOT DETECTED
Barbiturates: NOT DETECTED
Benzodiazepines: NOT DETECTED
Cocaine: NOT DETECTED
Opiates: NOT DETECTED
Tetrahydrocannabinol: NOT DETECTED

## 2011-05-12 LAB — WET PREP, GENITAL
Clue Cells Wet Prep HPF POC: NONE SEEN
Trich, Wet Prep: NONE SEEN
WBC, Wet Prep HPF POC: NONE SEEN
Yeast Wet Prep HPF POC: NONE SEEN

## 2011-05-12 LAB — RUBELLA SCREEN: Rubella: 24.4 IU/mL — ABNORMAL HIGH

## 2011-05-12 LAB — HEPATITIS B SURFACE ANTIGEN: Hepatitis B Surface Ag: NEGATIVE

## 2011-05-12 LAB — AMYLASE: Amylase: 40 U/L (ref 0–105)

## 2011-05-12 LAB — RPR: RPR Ser Ql: NONREACTIVE

## 2011-05-12 MED ORDER — ZOLPIDEM TARTRATE 10 MG PO TABS: 10.0000 mg | ORAL_TABLET | Freq: Every evening | ORAL | Status: DC | PRN

## 2011-05-12 MED ORDER — ONDANSETRON HCL 4 MG/2ML IJ SOLN
4.0000 mg | Freq: Once | INTRAMUSCULAR | Status: AC
  Administered 2011-05-12: 4 mg via INTRAVENOUS
  Filled 2011-05-12: qty 2

## 2011-05-12 MED ORDER — HYDROMORPHONE HCL PF 1 MG/ML IJ SOLN
1.0000 mg | Freq: Once | INTRAMUSCULAR | Status: AC
  Administered 2011-05-12: 1 mg via INTRAVENOUS
  Filled 2011-05-12: qty 1

## 2011-05-12 MED ORDER — PRENATAL MULTIVITAMIN CH
1.0000 | ORAL_TABLET | Freq: Every day | ORAL | Status: DC
  Administered 2011-05-12: 1 via ORAL
  Filled 2011-05-12: qty 1

## 2011-05-12 MED ORDER — CALCIUM CARBONATE ANTACID 500 MG PO CHEW: 2.0000 | CHEWABLE_TABLET | ORAL | Status: DC | PRN

## 2011-05-12 MED ORDER — OXYCODONE-ACETAMINOPHEN 5-325 MG PO TABS
1.0000 | ORAL_TABLET | ORAL | Status: DC | PRN
Start: 2011-05-12 — End: 2011-05-13

## 2011-05-12 MED ORDER — ACETAMINOPHEN 325 MG PO TABS: 650.0000 mg | ORAL_TABLET | ORAL | Status: DC | PRN

## 2011-05-12 MED ORDER — DOCUSATE SODIUM 100 MG PO CAPS
100.0000 mg | ORAL_CAPSULE | Freq: Every day | ORAL | Status: DC
  Administered 2011-05-12: 100 mg via ORAL
  Filled 2011-05-12: qty 1

## 2011-05-12 MED ORDER — LACTATED RINGERS IV BOLUS (SEPSIS)
1000.0000 mL | Freq: Once | INTRAVENOUS | Status: AC
  Administered 2011-05-12: 1000 mL via INTRAVENOUS

## 2011-05-12 MED ORDER — HYDROMORPHONE HCL 2 MG PO TABS
2.0000 mg | ORAL_TABLET | ORAL | Status: DC | PRN
  Administered 2011-05-12: 2 mg via ORAL
  Filled 2011-05-12: qty 1

## 2011-05-12 MED ORDER — ACETAMINOPHEN 500 MG PO TABS
1000.0000 mg | ORAL_TABLET | Freq: Once | ORAL | Status: AC
  Administered 2011-05-12: 1000 mg via ORAL
  Filled 2011-05-12: qty 2

## 2011-05-12 NOTE — H&P (Signed)
Scheryl Darter, MD Physician Signed  ED Provider Notes 05/12/2011 2:54 AM  History       Chief Complaint   Patient presents with   .  Abdominal Pain    HPI 27 y.o. G1P0 at [redacted]w[redacted]d with severe low abd and low back pain starting after going to bed at 9 PM tonight, constant with intermittent worsening. No bleeding, LOF, n/v, constipation, diarrhea, dysuria. No prenatal care, states she thinks she will get prenatal care from Dr. Gaynell Face. Seen at [facility] with abdominal pain in early pregnancy, had normal u/s and MRI at that time. Pt states this feels similar to kidney stone pain that she has had in the past.       Past Medical History   Diagnosis  Date   .  Kidney stones     .  Gall stones        History reviewed. No pertinent past surgical history.    Family History   Problem  Relation  Age of Onset   .  Cancer  Father         History   Substance Use Topics   .  Smoking status:  Never Smoker    .  Smokeless tobacco:  Never Used   .  Alcohol Use:  No      Allergies: No Known Allergies    Prescriptions prior to admission   Medication  Sig  Dispense  Refill   .  acetaminophen (TYLENOL) 500 MG tablet  Take 1,000 mg by mouth every 6 (six) hours as needed. For pain          .  ibuprofen (ADVIL,MOTRIN) 200 MG tablet  Take 400 mg by mouth every 6 (six) hours as needed. For pain             Review of Systems  Constitutional: Negative.   Respiratory: Negative.   Cardiovascular: Negative.   Gastrointestinal: Positive for abdominal pain. Negative for nausea, vomiting, diarrhea and constipation.  Genitourinary: Negative for dysuria, urgency, frequency, hematuria and flank pain.        Negative for vaginal bleeding, cramping/contractions  Musculoskeletal: Positive for back pain.  Neurological: Negative.   Psychiatric/Behavioral: Negative.   Physical Exam      Blood pressure 110/56, pulse 95, temperature 98.7 F (37.1 C), temperature source Oral, resp. rate 20, height 5\' 2"  (1.575  m), weight 198 lb (89.812 kg), SpO2 99.00%.   Physical Exam  Nursing note and vitals reviewed. Constitutional: She is oriented to person, place, and time. She appears well-developed and well-nourished. She appears distressed.  HENT:   Head: Normocephalic and atraumatic.  Cardiovascular: Normal rate.   Respiratory: Effort normal.  GI: Soft. Bowel sounds are normal. She exhibits no mass. There is tenderness (low abd). There is no rebound and no guarding.  Genitourinary: There is no rash or lesion on the right labia. There is no rash or lesion on the left labia. Uterus is not tender. Enlarged: Size c/w dates. Cervix exhibits no motion tenderness, no discharge and no friability. Right adnexum displays tenderness. Left adnexum displays tenderness. No tenderness or bleeding around the vagina. Vaginal discharge (white, mucousy) found.       Cervix closed  Musculoskeletal: Normal range of motion.  Neurological: She is alert and oriented to person, place, and time.  Skin: Skin is warm and dry.  Psychiatric: She has a normal mood and affect.     MAU Course    Procedures    Results for orders placed during  the hospital encounter of 05/12/11 (from the past 24 hour(s))   URINALYSIS, ROUTINE W REFLEX MICROSCOPIC     Status: Normal     Collection Time     05/12/11  2:30 AM       Component  Value  Range     Color, Urine  YELLOW   YELLOW      APPearance  CLEAR   CLEAR      Specific Gravity, Urine  1.015   1.005 - 1.030      pH  7.5   5.0 - 8.0      Glucose, UA  NEGATIVE   NEGATIVE (mg/dL)     Hgb urine dipstick  NEGATIVE   NEGATIVE      Bilirubin Urine  NEGATIVE   NEGATIVE      Ketones, ur  NEGATIVE   NEGATIVE (mg/dL)     Protein, ur  NEGATIVE   NEGATIVE (mg/dL)     Urobilinogen, UA  1.0   0.0 - 1.0 (mg/dL)     Nitrite  NEGATIVE   NEGATIVE      Leukocytes, UA  NEGATIVE   NEGATIVE    URINE RAPID DRUG SCREEN (HOSP PERFORMED)     Status: Normal     Collection Time     05/12/11  2:30 AM        Component  Value  Range     Opiates  NONE DETECTED   NONE DETECTED      Cocaine  NONE DETECTED   NONE DETECTED      Benzodiazepines  NONE DETECTED   NONE DETECTED      Amphetamines  NONE DETECTED   NONE DETECTED      Tetrahydrocannabinol  NONE DETECTED   NONE DETECTED      Barbiturates  NONE DETECTED   NONE DETECTED    WET PREP, GENITAL     Status: Normal     Collection Time     05/12/11  2:50 AM       Component  Value  Range     Yeast Wet Prep HPF POC  NONE SEEN   NONE SEEN      Trich, Wet Prep  NONE SEEN   NONE SEEN      Clue Cells Wet Prep HPF POC  NONE SEEN   NONE SEEN      WBC, Wet Prep HPF POC  NONE SEEN   NONE SEEN    CBC     Status: Abnormal     Collection Time     05/12/11  3:05 AM       Component  Value  Range     WBC  13.9 (*)  4.0 - 10.5 (K/uL)     RBC  4.02   3.87 - 5.11 (MIL/uL)     Hemoglobin  11.4 (*)  12.0 - 15.0 (g/dL)     HCT  16.1 (*)  09.6 - 46.0 (%)     MCV  83.8   78.0 - 100.0 (fL)     MCH  28.4   26.0 - 34.0 (pg)     MCHC  33.8   30.0 - 36.0 (g/dL)     RDW  04.5   40.9 - 15.5 (%)     Platelets  167   150 - 400 (K/uL)   COMPREHENSIVE METABOLIC PANEL     Status: Abnormal     Collection Time     05/12/11  3:05 AM  Component  Value  Range     Sodium  132 (*)  135 - 145 (mEq/L)     Potassium  3.5   3.5 - 5.1 (mEq/L)     Chloride  101   96 - 112 (mEq/L)     CO2  22   19 - 32 (mEq/L)     Glucose, Bld  142 (*)  70 - 99 (mg/dL)     BUN  8   6 - 23 (mg/dL)     Creatinine, Ser  1.61   0.50 - 1.10 (mg/dL)     Calcium  8.9   8.4 - 10.5 (mg/dL)     Total Protein  6.6   6.0 - 8.3 (g/dL)     Albumin  2.8 (*)  3.5 - 5.2 (g/dL)     AST  12   0 - 37 (U/L)     ALT  9   0 - 35 (U/L)     Alkaline Phosphatase  50   39 - 117 (U/L)     Total Bilirubin  0.3   0.3 - 1.2 (mg/dL)     GFR calc non Af Amer  >90   >90 (mL/min)     GFR calc Af Amer  >90   >90 (mL/min)    U/S: FHR 158, AFV nml, Placenta anterior, above cervical os, EFW 55%ile, basic anat WNL, cervical length  3.9cm, ovaries/adnexa WNL   After Dilaudid 1 mg IV, Tylenol 1000 mg PO and return from u/s, pt is more comfortable, but still having low abd pain, very tender LLQ, no tenderness in RLQ. Temp decreased to 98.7.    Consulted with Dr. Macon Large, recommends CT to eval kidneys and bowel, as there is no obvious source of pain/fever as of yet.     Assessment and Plan    Care assumed by [facility], NP   [facility] 05/12/2011, 5:15 AM    8:00am patient in CT, had complained of pain returning and dilaudid 1 mg was repeated prior to going to radiology. Will also repeat CBC and add diff. And vital signs.   Ct Abdomen Pelvis Wo Contrast   05/12/2011  *RADIOLOGY REPORT*  Clinical Data: Left lower quadrant pain and fever.  [redacted] weeks pregnant.  CT ABDOMEN AND PELVIS WITHOUT CONTRAST  Technique:  Multidetector CT imaging of the abdomen and pelvis was performed following the standard protocol without intravenous contrast. Informed consent was obtained from the patient by Dr. Cherly Hensen.  Comparison: None.  Findings: A single intrauterine fetus is seen in cephalic presentation.  No soft tissue masses or lymphadenopathy identified within the pelvis.  No inflammatory process is identified, and there is no evidence of abscess or free fluid.  No evidence of bowel wall thickening or dilatation.  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  Mild pelvicaliectasis is seen, however there is no evidence of ureteral calculi.  This is consistent with mild physiologic hydronephrosis of pregnancy.  A 2 mm nonobstructing calculus is noted in the upper pole of the right kidney.  Left kidney has a normal appearance on this noncontrast study.  The other abdominal parenchymal organs also have a normal appearance on this noncontrast exam.  Small calcified gallstones are seen however there is no evidence of cholecystitis.  IMPRESSION:  1.  Single intrauterine fetus in cephalic presentation. 2.  No evidence of inflammatory process,  abscess, or soft tissue mass. 3.  Mild right hydronephrosis of pregnancy.  No evidence of ureteral calculus. 4.  Ptotic  and malrotated right kidney incidentally noted, with 2 mm nonobstructing calculus in the upper pole. 5.  Cholelithiasis, without evidence of cholecystitis.  Original Report Authenticated By: Danae Orleans, M.D.    US Ob Comp + 14 Wk   05/12/2011  OBSTETRICAL ULTRASOUND: This exam was performed within a [facility]. The OB US report was generated in the [facility] system, and faxed to the ordering physician.   This report is also available in [employer] and in the [employer]. See [facility] Obstetric US report.   Results for orders placed during the hospital encounter of 05/12/11 (from the past 24 hour(s))   URINALYSIS, ROUTINE W REFLEX MICROSCOPIC     Status: Normal     Collection Time     05/12/11  2:30 AM       Component  Value  Range     Color, Urine  YELLOW   YELLOW      APPearance  CLEAR   CLEAR      Specific Gravity, Urine  1.015   1.005 - 1.030      pH  7.5   5.0 - 8.0      Glucose, UA  NEGATIVE   NEGATIVE (mg/dL)     Hgb urine dipstick  NEGATIVE   NEGATIVE      Bilirubin Urine  NEGATIVE   NEGATIVE      Ketones, ur  NEGATIVE   NEGATIVE (mg/dL)     Protein, ur  NEGATIVE   NEGATIVE (mg/dL)     Urobilinogen, UA  1.0   0.0 - 1.0 (mg/dL)     Nitrite  NEGATIVE   NEGATIVE      Leukocytes, UA  NEGATIVE   NEGATIVE    URINE RAPID DRUG SCREEN (HOSP PERFORMED)     Status: Normal     Collection Time     05/12/11  2:30 AM       Component  Value  Range     Opiates  NONE DETECTED   NONE DETECTED      Cocaine  NONE DETECTED   NONE DETECTED      Benzodiazepines  NONE DETECTED   NONE DETECTED      Amphetamines  NONE DETECTED   NONE DETECTED      Tetrahydrocannabinol  NONE DETECTED   NONE DETECTED      Barbiturates  NONE DETECTED   NONE DETECTED    WET PREP, GENITAL     Status: Normal     Collection Time     05/12/11  2:50 AM       Component  Value   Range     Yeast Wet Prep HPF POC  NONE SEEN   NONE SEEN      Trich, Wet Prep  NONE SEEN   NONE SEEN      Clue Cells Wet Prep HPF POC  NONE SEEN   NONE SEEN      WBC, Wet Prep HPF POC  NONE SEEN   NONE SEEN    CBC     Status: Abnormal     Collection Time     05/12/11  3:05 AM       Component  Value  Range     WBC  13.9 (*)  4.0 - 10.5 (K/uL)     RBC  4.02   3.87 - 5.11 (MIL/uL)     Hemoglobin  11.4 (*)  12.0 - 15.0 (g/dL)     HCT  62.6 (*)  36.0 - 46.0 (%)     MCV  83.8   78.0 - 100.0 (fL)     MCH  28.4   26.0 - 34.0 (pg)     MCHC  33.8   30.0 - 36.0 (g/dL)     RDW  16.1   09.6 - 15.5 (%)     Platelets  167   150 - 400 (K/uL)   COMPREHENSIVE METABOLIC PANEL     Status: Abnormal     Collection Time     05/12/11  3:05 AM       Component  Value  Range     Sodium  132 (*)  135 - 145 (mEq/L)     Potassium  3.5   3.5 - 5.1 (mEq/L)     Chloride  101   96 - 112 (mEq/L)     CO2  22   19 - 32 (mEq/L)     Glucose, Bld  142 (*)  70 - 99 (mg/dL)     BUN  8   6 - 23 (mg/dL)     Creatinine, Ser  0.45   0.50 - 1.10 (mg/dL)     Calcium  8.9   8.4 - 10.5 (mg/dL)     Total Protein  6.6   6.0 - 8.3 (g/dL)     Albumin  2.8 (*)  3.5 - 5.2 (g/dL)     AST  12   0 - 37 (U/L)     ALT  9   0 - 35 (U/L)     Alkaline Phosphatase  50   39 - 117 (U/L)     Total Bilirubin  0.3   0.3 - 1.2 (mg/dL)     GFR calc non Af Amer  >90   >90 (mL/min)     GFR calc Af Amer  >90   >90 (mL/min)   CBC     Status: Abnormal     Collection Time     05/12/11  8:13 AM       Component  Value  Range     WBC  16.1 (*)  4.0 - 10.5 (K/uL)     RBC  3.73 (*)  3.87 - 5.11 (MIL/uL)     Hemoglobin  10.5 (*)  12.0 - 15.0 (g/dL)     HCT  40.9 (*)  81.1 - 46.0 (%)     MCV  83.9   78.0 - 100.0 (fL)     MCH  28.2   26.0 - 34.0 (pg)     MCHC  33.5   30.0 - 36.0 (g/dL)     RDW  91.4   78.2 - 15.5 (%)     Platelets  165   150 - 400 (K/uL)   DIFFERENTIAL     Status: Abnormal     Collection Time     05/12/11  8:13 AM       Component   Value  Range     Neutrophils Relative  88 (*)  43 - 77 (%)     Neutro Abs  14.2 (*)  1.7 - 7.7 (K/uL)     Lymphocytes Relative  7 (*)  12 - 46 (%)     Lymphs Abs  1.1   0.7 - 4.0 (K/uL)     Monocytes Relative  5   3 - 12 (%)     Monocytes Absolute  0.7   0.1 - 1.0 (K/uL)     Eosinophils Relative  1  0 - 5 (%)     Eosinophils Absolute  0.1   0.0 - 0.7 (K/uL)     Basophils Relative  0   0 - 1 (%)     Basophils Absolute  0.0   0.0 - 0.1 (K/uL)    08:49 am Consult with Dr. Debroah Loop and he will see the patient in MAU.     [place], [place] 05/12/11 [phone]   I reviewed the patient's history and CT scan and lab result. I examined her and I agree with the findings on the above note. She will be hospitalized on observation with IV fluids,pain management. Will consider general surgery consult if not improving.    Norma Ignasiak

## 2011-05-12 NOTE — ED Provider Notes (Signed)
History     Chief Complaint  Patient presents with  . Abdominal Pain   HPI 26 y.o. G1P0 at 108w4d with severe low abd and low back pain starting after going to bed at 9 PM tonight, constant with intermittent worsening. No bleeding, LOF, n/v, constipation, diarrhea, dysuria. No prenatal care, states she thinks she will get prenatal care from Dr. Gaynell Mejia. Seen at [facility] with abdominal pain in early pregnancy, had normal u/s and MRI at that time. Pt states this feels similar to kidney stone pain that she has had in the past.    Past Medical History  Diagnosis Date  . Kidney stones   . Gall stones     History reviewed. No pertinent past surgical history.  Family History  Problem Relation Age of Onset  . Cancer Father     History  Substance Use Topics  . Smoking status: Never Smoker   . Smokeless tobacco: Never Used  . Alcohol Use: No    Allergies: No Known Allergies  Prescriptions prior to admission  Medication Sig Dispense Refill  . acetaminophen (TYLENOL) 500 MG tablet Take 1,000 mg by mouth every 6 (six) hours as needed. For pain       . ibuprofen (ADVIL,MOTRIN) 200 MG tablet Take 400 mg by mouth every 6 (six) hours as needed. For pain         Review of Systems  Constitutional: Negative.   Respiratory: Negative.   Cardiovascular: Negative.   Gastrointestinal: Positive for abdominal pain. Negative for nausea, vomiting, diarrhea and constipation.  Genitourinary: Negative for dysuria, urgency, frequency, hematuria and flank pain.       Negative for vaginal bleeding, cramping/contractions  Musculoskeletal: Positive for back pain.  Neurological: Negative.   Psychiatric/Behavioral: Negative.    Physical Exam   Blood pressure 110/56, pulse 95, temperature 98.7 F (37.1 C), temperature source Oral, resp. rate 20, height 5\' 2"  (1.575 m), weight 198 lb (89.812 kg), SpO2 99.00%.  Physical Exam  Nursing note and vitals reviewed. Constitutional: She is oriented to person,  place, and time. She appears well-developed and well-nourished. She appears distressed.  HENT:  Head: Normocephalic and atraumatic.  Cardiovascular: Normal rate.   Respiratory: Effort normal.  GI: Soft. Bowel sounds are normal. She exhibits no mass. There is tenderness (low abd). There is no rebound and no guarding.  Genitourinary: There is no rash or lesion on the right labia. There is no rash or lesion on the left labia. Uterus is not tender. Enlarged: Size c/w dates. Cervix exhibits no motion tenderness, no discharge and no friability. Right adnexum displays tenderness. Left adnexum displays tenderness. No tenderness or bleeding around the vagina. Vaginal discharge (white, mucousy) found.       Cervix closed   Musculoskeletal: Normal range of motion.  Neurological: She is alert and oriented to person, place, and time.  Skin: Skin is warm and dry.  Psychiatric: She has a normal mood and affect.    MAU Course  Procedures  Results for orders placed during the hospital encounter of 05/12/11 (from the past 24 hour(s))  URINALYSIS, ROUTINE W REFLEX MICROSCOPIC     Status: Normal   Collection Time   05/12/11  2:30 AM      Component Value Range   Color, Urine YELLOW  YELLOW    APPearance CLEAR  CLEAR    Specific Gravity, Urine 1.015  1.005 - 1.030    pH 7.5  5.0 - 8.0    Glucose, UA NEGATIVE  NEGATIVE (mg/dL)  Hgb urine dipstick NEGATIVE  NEGATIVE    Bilirubin Urine NEGATIVE  NEGATIVE    Ketones, ur NEGATIVE  NEGATIVE (mg/dL)   Protein, ur NEGATIVE  NEGATIVE (mg/dL)   Urobilinogen, UA 1.0  0.0 - 1.0 (mg/dL)   Nitrite NEGATIVE  NEGATIVE    Leukocytes, UA NEGATIVE  NEGATIVE   URINE RAPID DRUG SCREEN (HOSP PERFORMED)     Status: Normal   Collection Time   05/12/11  2:30 AM      Component Value Range   Opiates NONE DETECTED  NONE DETECTED    Cocaine NONE DETECTED  NONE DETECTED    Benzodiazepines NONE DETECTED  NONE DETECTED    Amphetamines NONE DETECTED  NONE DETECTED     Tetrahydrocannabinol NONE DETECTED  NONE DETECTED    Barbiturates NONE DETECTED  NONE DETECTED   WET PREP, GENITAL     Status: Normal   Collection Time   05/12/11  2:50 AM      Component Value Range   Yeast Wet Prep HPF POC NONE SEEN  NONE SEEN    Trich, Wet Prep NONE SEEN  NONE SEEN    Clue Cells Wet Prep HPF POC NONE SEEN  NONE SEEN    WBC, Wet Prep HPF POC NONE SEEN  NONE SEEN   CBC     Status: Abnormal   Collection Time   05/12/11  3:05 AM      Component Value Range   WBC 13.9 (*) 4.0 - 10.5 (K/uL)   RBC 4.02  3.87 - 5.11 (MIL/uL)   Hemoglobin 11.4 (*) 12.0 - 15.0 (g/dL)   HCT 78.2 (*) 95.6 - 46.0 (%)   MCV 83.8  78.0 - 100.0 (fL)   MCH 28.4  26.0 - 34.0 (pg)   MCHC 33.8  30.0 - 36.0 (g/dL)   RDW 21.3  08.6 - 57.8 (%)   Platelets 167  150 - 400 (K/uL)  COMPREHENSIVE METABOLIC PANEL     Status: Abnormal   Collection Time   05/12/11  3:05 AM      Component Value Range   Sodium 132 (*) 135 - 145 (mEq/L)   Potassium 3.5  3.5 - 5.1 (mEq/L)   Chloride 101  96 - 112 (mEq/L)   CO2 22  19 - 32 (mEq/L)   Glucose, Bld 142 (*) 70 - 99 (mg/dL)   BUN 8  6 - 23 (mg/dL)   Creatinine, Ser 4.69  0.50 - 1.10 (mg/dL)   Calcium 8.9  8.4 - 62.9 (mg/dL)   Total Protein 6.6  6.0 - 8.3 (g/dL)   Albumin 2.8 (*) 3.5 - 5.2 (g/dL)   AST 12  0 - 37 (U/L)   ALT 9  0 - 35 (U/L)   Alkaline Phosphatase 50  39 - 117 (U/L)   Total Bilirubin 0.3  0.3 - 1.2 (mg/dL)   GFR calc non Af Amer >90  >90 (mL/min)   GFR calc Af Amer >90  >90 (mL/min)   U/S: FHR 158, AFV nml, Placenta anterior, above cervical os, EFW 55%ile, basic anat WNL, cervical length 3.9cm, ovaries/adnexa WNL  After Dilaudid 1 mg IV, Tylenol 1000 mg PO and return from u/s, pt is more comfortable, but still having low abd pain, very tender LLQ, no tenderness in RLQ. Temp decreased to 98.7.   Consulted with Dr. Macon Large, recommends CT to eval kidneys and bowel, as there is no obvious source of pain/fever as of yet.   Assessment and Plan  Care  assumed by [facility]  Damian Leavell, NP  Georges Mouse 05/12/2011, 5:15 AM   8:00am patient in CT, had complained of pain returning and dilaudid 1 mg was repeated prior to going to radiology. Will also repeat CBC and add diff. And vital signs.  Ct Abdomen Pelvis Wo Contrast  05/12/2011  *RADIOLOGY REPORT*  Clinical Data: Left lower quadrant pain and fever.  [redacted] weeks pregnant.  CT ABDOMEN AND PELVIS WITHOUT CONTRAST  Technique:  Multidetector CT imaging of the abdomen and pelvis was performed following the standard protocol without intravenous contrast. Informed consent was obtained from the patient by Dr. Cherly Hensen.  Comparison: None.  Findings: A single intrauterine fetus is seen in cephalic presentation.  No soft tissue masses or lymphadenopathy identified within the pelvis.  No inflammatory process is identified, and there is no evidence of abscess or free fluid.  No evidence of bowel wall thickening or dilatation.  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  Mild pelvicaliectasis is seen, however there is no evidence of ureteral calculi.  This is consistent with mild physiologic hydronephrosis of pregnancy.  A 2 mm nonobstructing calculus is noted in the upper pole of the right kidney.  Left kidney has a normal appearance on this noncontrast study.  The other abdominal parenchymal organs also have a normal appearance on this noncontrast exam.  Small calcified gallstones are seen however there is no evidence of cholecystitis.  IMPRESSION:  1.  Single intrauterine fetus in cephalic presentation. 2.  No evidence of inflammatory process, abscess, or soft tissue mass. 3.  Mild right hydronephrosis of pregnancy.  No evidence of ureteral calculus. 4.  Ptotic and malrotated right kidney incidentally noted, with 2 mm nonobstructing calculus in the upper pole. 5.  Cholelithiasis, without evidence of cholecystitis.  Original Report Authenticated By: Danae Orleans, M.D.   US Ob Comp + 14 Wk  05/12/2011  OBSTETRICAL ULTRASOUND:  This exam was performed within a [facility] Department. The OB US report was generated in the [facility] system, and faxed to the ordering physician.   This report is also available in [employer] and in the [employer]. See [facility] Obstetric US report.   Results for orders placed during the hospital encounter of 05/12/11 (from the past 24 hour(s))  URINALYSIS, ROUTINE W REFLEX MICROSCOPIC     Status: Normal   Collection Time   05/12/11  2:30 AM      Component Value Range   Color, Urine YELLOW  YELLOW    APPearance CLEAR  CLEAR    Specific Gravity, Urine 1.015  1.005 - 1.030    pH 7.5  5.0 - 8.0    Glucose, UA NEGATIVE  NEGATIVE (mg/dL)   Hgb urine dipstick NEGATIVE  NEGATIVE    Bilirubin Urine NEGATIVE  NEGATIVE    Ketones, ur NEGATIVE  NEGATIVE (mg/dL)   Protein, ur NEGATIVE  NEGATIVE (mg/dL)   Urobilinogen, UA 1.0  0.0 - 1.0 (mg/dL)   Nitrite NEGATIVE  NEGATIVE    Leukocytes, UA NEGATIVE  NEGATIVE   URINE RAPID DRUG SCREEN (HOSP PERFORMED)     Status: Normal   Collection Time   05/12/11  2:30 AM      Component Value Range   Opiates NONE DETECTED  NONE DETECTED    Cocaine NONE DETECTED  NONE DETECTED    Benzodiazepines NONE DETECTED  NONE DETECTED    Amphetamines NONE DETECTED  NONE DETECTED    Tetrahydrocannabinol NONE DETECTED  NONE DETECTED    Barbiturates NONE DETECTED  NONE DETECTED  WET PREP, GENITAL     Status: Normal   Collection Time   05/12/11  2:50 AM      Component Value Range   Yeast Wet Prep HPF POC NONE SEEN  NONE SEEN    Trich, Wet Prep NONE SEEN  NONE SEEN    Clue Cells Wet Prep HPF POC NONE SEEN  NONE SEEN    WBC, Wet Prep HPF POC NONE SEEN  NONE SEEN   CBC     Status: Abnormal   Collection Time   05/12/11  3:05 AM      Component Value Range   WBC 13.9 (*) 4.0 - 10.5 (K/uL)   RBC 4.02  3.87 - 5.11 (MIL/uL)   Hemoglobin 11.4 (*) 12.0 - 15.0 (g/dL)   HCT 16.1 (*) 09.6 - 46.0 (%)   MCV 83.8  78.0 - 100.0 (fL)   MCH 28.4  26.0 - 34.0  (pg)   MCHC 33.8  30.0 - 36.0 (g/dL)   RDW 04.5  40.9 - 81.1 (%)   Platelets 167  150 - 400 (K/uL)  COMPREHENSIVE METABOLIC PANEL     Status: Abnormal   Collection Time   05/12/11  3:05 AM      Component Value Range   Sodium 132 (*) 135 - 145 (mEq/L)   Potassium 3.5  3.5 - 5.1 (mEq/L)   Chloride 101  96 - 112 (mEq/L)   CO2 22  19 - 32 (mEq/L)   Glucose, Bld 142 (*) 70 - 99 (mg/dL)   BUN 8  6 - 23 (mg/dL)   Creatinine, Ser 9.14  0.50 - 1.10 (mg/dL)   Calcium 8.9  8.4 - 78.2 (mg/dL)   Total Protein 6.6  6.0 - 8.3 (g/dL)   Albumin 2.8 (*) 3.5 - 5.2 (g/dL)   AST 12  0 - 37 (U/L)   ALT 9  0 - 35 (U/L)   Alkaline Phosphatase 50  39 - 117 (U/L)   Total Bilirubin 0.3  0.3 - 1.2 (mg/dL)   GFR calc non Af Amer >90  >90 (mL/min)   GFR calc Af Amer >90  >90 (mL/min)  CBC     Status: Abnormal   Collection Time   05/12/11  8:13 AM      Component Value Range   WBC 16.1 (*) 4.0 - 10.5 (K/uL)   RBC 3.73 (*) 3.87 - 5.11 (MIL/uL)   Hemoglobin 10.5 (*) 12.0 - 15.0 (g/dL)   HCT 95.6 (*) 21.3 - 46.0 (%)   MCV 83.9  78.0 - 100.0 (fL)   MCH 28.2  26.0 - 34.0 (pg)   MCHC 33.5  30.0 - 36.0 (g/dL)   RDW 08.6  57.8 - 46.9 (%)   Platelets 165  150 - 400 (K/uL)  DIFFERENTIAL     Status: Abnormal   Collection Time   05/12/11  8:13 AM      Component Value Range   Neutrophils Relative 88 (*) 43 - 77 (%)   Neutro Abs 14.2 (*) 1.7 - 7.7 (K/uL)   Lymphocytes Relative 7 (*) 12 - 46 (%)   Lymphs Abs 1.1  0.7 - 4.0 (K/uL)   Monocytes Relative 5  3 - 12 (%)   Monocytes Absolute 0.7  0.1 - 1.0 (K/uL)   Eosinophils Relative 1  0 - 5 (%)   Eosinophils Absolute 0.1  0.0 - 0.7 (K/uL)   Basophils Relative 0  0 - 1 (%)   Basophils Absolute 0.0  0.0 - 0.1 (K/uL)  08:49 am Consult with Dr. Debroah Loop and he will see the patient in MAU.   [place], [place] 05/12/11 [phone]  I reviewed the patient's history and CT scan and lab result. I examined her and I agree with the findings on the above note. She will be  hospitalized on observation with IV fluids,pain management. Will consider general surgery consult if not improving.   Fahim Kats

## 2011-05-12 NOTE — Progress Notes (Signed)
Pt had pain in lower abd all day- has worsened tonight   -

## 2011-05-13 DIAGNOSIS — O26899 Other specified pregnancy related conditions, unspecified trimester: Secondary | ICD-10-CM

## 2011-05-13 DIAGNOSIS — R109 Unspecified abdominal pain: Secondary | ICD-10-CM

## 2011-05-13 LAB — GC/CHLAMYDIA PROBE AMP, GENITAL
Chlamydia, DNA Probe: NEGATIVE
GC Probe Amp, Genital: NEGATIVE

## 2011-05-13 MED ORDER — OXYCODONE-ACETAMINOPHEN 5-325 MG PO TABS
1.0000 | ORAL_TABLET | ORAL | Status: AC | PRN
Start: 2011-05-13 — End: 2011-05-23

## 2011-05-13 MED ORDER — DSS 100 MG PO CAPS: 100.0000 mg | ORAL_CAPSULE | Freq: Two times a day (BID) | ORAL | Status: AC

## 2011-05-13 MED ORDER — PRENATAL MULTIVITAMIN CH: 1.0000 | ORAL_TABLET | Freq: Every day | ORAL | Status: DC

## 2011-05-13 NOTE — Discharge Summary (Signed)
Physician Discharge Summary  Patient ID: Caitlin Mejia MRN: [id number] DOB/AGE: 1985-12-01 26 y.o.  Admit date: 05/12/2011 Discharge date: 05/13/2011  Admission Diagnoses: Abdominal pain  Discharge Diagnoses: same Active Problems:  * No active hospital problems. *    Discharged Condition: good  Hospital Course: 26 yo G5P4 admitted for observation secondary to abdominal pain. Patient with h/o kidney stone however CT abd/pelvis demonstrated a non-obstructive right kidney stone. Normal fetal anatomy. Throughout her stay, patient remained afebrile, her WBC normalized and her pain resolved.  Consults: None  Significant Diagnostic Studies: radiology: CT scan: IMPRESSION:  1. Single intrauterine fetus in cephalic presentation.  2. No evidence of inflammatory process, abscess, or soft tissue  mass.  3. Mild right hydronephrosis of pregnancy. No evidence of  ureteral calculus.  4. Ptotic and malrotated right kidney incidentally noted, with 2  mm nonobstructing calculus in the upper pole.  5. Cholelithiasis, without evidence of cholecystitis.   Treatments: IV hydration and analgesia: acetaminophen w/ codeine  Discharge Exam: Blood pressure 94/61, pulse 72, temperature 97.8 F (36.6 C), temperature source Oral, resp. rate 18, height 5\' 2"  (1.575 m), weight 88.083 kg (194 lb 3 oz), SpO2 99.00%. General appearance: alert, cooperative and no distress GI: soft, non-tender; bowel sounds normal; no masses,  no organomegaly Extremities: extremities normal, atraumatic, no cyanosis or edema and Homans sign is negative, no sign of DVT  Disposition: 01-Home or Self Care   Medication List  As of 05/13/2011  9:22 AM   STOP taking these medications         acetaminophen 500 MG tablet      ibuprofen 200 MG tablet         TAKE these medications         DSS 100 MG Caps   Take 100 mg by mouth 2 (two) times daily.      oxyCODONE-acetaminophen 5-325 MG per tablet   Commonly known as:  PERCOCET   Take 1-2 tablets by mouth every 4 (four) hours as needed.      prenatal multivitamin Tabs   Take 1 tablet by mouth daily.           Follow-up Information    Schedule an appointment as soon as possible for a visit with Kathreen Cosier, MD.   Contact information:   [address] [place] [address] [phone]          Signed: Catalina Antigua 05/13/2011, 9:22 AM

## 2011-05-13 NOTE — Progress Notes (Signed)
Patient ID: Caitlin Mejia, female   DOB: 1985-10-17, 26 y.o.   MRN: [id number] FACULTY PRACTICE ANTEPARTUM(COMPREHENSIVE) NOTE  Caitlin Mejia is a 26 y.o. G5P4004 at [redacted]w[redacted]d by early ultrasound who is admitted for lower abdominal pain.   Length of Stay:  1  Days  Subjective: Patient reports significant improvement in her symptoms since admission. Denies nausea, emesis, able to tolerate po. Patient reports the fetal movement as active. Patient reports uterine contraction  activity as none. Patient reports  vaginal bleeding as none. Patient describes fluid per vagina as None.  Vitals:  Blood pressure 94/61, pulse 72, temperature 97.8 F (36.6 C), temperature source Oral, resp. rate 18, height 5\' 2"  (1.575 m), weight 88.083 kg (194 lb 3 oz), SpO2 99.00%. Physical Examination:  General appearance - alert, well appearing, and in no distress Abdomen: Soft, NT, ND Extremities: extremities normal, atraumatic, no cyanosis or edema, Homans sign is negative, no sign of DVT and no edema, redness or tenderness in the calves or thighs with DTRs 2+ bilaterally Membranes:intact   Labs:  Recent Results (from the past 24 hour(s))  HEPATITIS B SURFACE ANTIGEN   Collection Time   05/12/11  3:08 PM      Component Value Range   Hepatitis B Surface Ag NEGATIVE  NEGATIVE   RUBELLA SCREEN   Collection Time   05/12/11  3:08 PM      Component Value Range   Rubella 24.4 (*)   RPR   Collection Time   05/12/11  3:08 PM      Component Value Range   RPR NON REACTIVE  NON REACTIVE   CBC   Collection Time   05/12/11  3:08 PM      Component Value Range   WBC 9.1  4.0 - 10.5 (K/uL)   RBC 3.56 (*) 3.87 - 5.11 (MIL/uL)   Hemoglobin 10.0 (*) 12.0 - 15.0 (g/dL)   HCT 40.9 (*) 81.1 - 46.0 (%)   MCV 84.8  78.0 - 100.0 (fL)   MCH 28.1  26.0 - 34.0 (pg)   MCHC 33.1  30.0 - 36.0 (g/dL)   RDW 91.4  78.2 - 95.6 (%)   Platelets 129 (*) 150 - 400 (K/uL)  DIFFERENTIAL   Collection Time   05/12/11  3:08 PM     Component Value Range   Neutrophils Relative 81 (*) 43 - 77 (%)   Neutro Abs 7.3  1.7 - 7.7 (K/uL)   Lymphocytes Relative 10 (*) 12 - 46 (%)   Lymphs Abs 0.9  0.7 - 4.0 (K/uL)   Monocytes Relative 6  3 - 12 (%)   Monocytes Absolute 0.6  0.1 - 1.0 (K/uL)   Eosinophils Relative 3  0 - 5 (%)   Eosinophils Absolute 0.2  0.0 - 0.7 (K/uL)   Basophils Relative 0  0 - 1 (%)   Basophils Absolute 0.0  0.0 - 0.1 (K/uL)  BASIC METABOLIC PANEL   Collection Time   05/12/11  3:08 PM      Component Value Range   Sodium 136  135 - 145 (mEq/L)   Potassium 3.2 (*) 3.5 - 5.1 (mEq/L)   Chloride 106  96 - 112 (mEq/L)   CO2 25  19 - 32 (mEq/L)   Glucose, Bld 102 (*) 70 - 99 (mg/dL)   BUN 5 (*) 6 - 23 (mg/dL)   Creatinine, Ser 2.13  0.50 - 1.10 (mg/dL)   Calcium 8.4  8.4 - 08.6 (mg/dL)   GFR calc non  Af Amer >90  >90 (mL/min)   GFR calc Af Amer >90  >90 (mL/min)    Imaging Studies:    CT IMPRESSION:  1. Single intrauterine fetus in cephalic presentation.  2. No evidence of inflammatory process, abscess, or soft tissue  mass.  3. Mild right hydronephrosis of pregnancy. No evidence of  ureteral calculus.  4. Ptotic and malrotated right kidney incidentally noted, with 2  mm nonobstructing calculus in the upper pole.  5. Cholelithiasis, without evidence of cholecystitis.  Medications:  Scheduled    . docusate sodium  100 mg Oral Daily  . prenatal multivitamin  1 tablet Oral Daily   I have reviewed the patient's current medications.  ASSESSMENT: There is no problem list on file for this patient.   PLAN: 26 yo G5P4004 admitted with abdominal pain which has improved - Patient medically stable to be discharged home - Patient plans to initiate prenatal care with Dr. Trudie Buckler - Discharge instructions provided  Caitlin Mejia 05/13/2011,9:12 AM

## 2011-05-13 NOTE — Discharge Instructions (Signed)
Abdominal Pain During Pregnancy Abdominal discomfort is common in pregnancy. Most of the time, it does not cause harm. There are many causes of abdominal pain. Some causes are more serious than others. Some of the causes of abdominal pain in pregnancy are easily diagnosed. Occasionally, the diagnosis takes time to understand. Other times, the cause is not determined. Abdominal pain can be a sign that something is very wrong with the pregnancy, or the pain may have nothing to do with the pregnancy at all. For this reason, always tell your caregiver if you have any abdominal discomfort. CAUSES Common and harmless causes of abdominal pain include:  Constipation.   Excess gas and bloating.   Round ligament pain. This is pain that is felt in the folds of the groin.   The position the baby or placenta is in.   Baby kicks.   Braxton-Hicks contractions. These are mild contractions that do not cause cervical dilation.  Serious causes of abdominal pain include:  Ectopic pregnancy. This happens when a fertilized egg implants outside of the uterus.   Miscarriage.   Preterm labor. This is when labor starts at less than 37 weeks of pregnancy.   Placental abruption. This is when the placenta partially or completely separates from the uterus.   Preeclampsia. This is often associated with high blood pressure and has been referred to as "toxemia in pregnancy."   Uterine or amniotic fluid infections.  Causes unrelated to pregnancy include:  Urinary tract infection.   Gallbladder stones or inflammation.   Hepatitis or other liver illness.   Intestinal problems, stomach flu, food poisoning, or ulcer.   Appendicitis.   Kidney (renal) stones.   Kidney infection (pylonephritis).  HOME CARE INSTRUCTIONS  For mild pain:  Do not have sexual intercourse or put anything in your vagina until your symptoms go away completely.   Get plenty of rest until your pain improves. If your pain does not  improve in 1 hour, call your caregiver.   Drink clear fluids if you feel nauseous. Avoid solid food as long as you are uncomfortable or nauseous.   Only take medicine as directed by your caregiver.   Keep all follow-up appointments with your caregiver.  SEEK IMMEDIATE MEDICAL CARE IF:  You are bleeding, leaking fluid, or passing tissue from the vagina.   You have increasing pain or cramping.   You have persistent vomiting.   You have painful or bloody urination.   You have a fever.   You notice a decrease in your baby's movements.   You have extreme weakness or feel faint.   You have shortness of breath, with or without abdominal pain.   You develop a severe headache with abdominal pain.   You have abnormal vaginal discharge with abdominal pain.   You have persistent diarrhea.   You have abdominal pain that continues even after rest, or gets worse.  MAKE SURE YOU:   Understand these instructions.   Will watch your condition.   Will get help right away if you are not doing well or get worse.  Document Released: 03/06/2005 Document Revised: 11/16/2010 Document Reviewed: 09/30/2010 [employer] Patient Information 2012 [employer]. 

## 2011-05-15 ENCOUNTER — Inpatient Hospital Stay (HOSPITAL_COMMUNITY)
Admission: AD | Admit: 2011-05-15 | Discharge: 2011-05-15 | Disposition: A | Discharge status: Home / Self Care | Payer: Self-pay | Source: Ambulatory Visit | Attending: Obstetrics & Gynecology | Admitting: Obstetrics & Gynecology

## 2011-05-15 ENCOUNTER — Encounter (HOSPITAL_COMMUNITY): Payer: Self-pay | Admitting: *Deleted

## 2011-05-15 DIAGNOSIS — O99891 Other specified diseases and conditions complicating pregnancy: Secondary | ICD-10-CM | POA: Insufficient documentation

## 2011-05-15 DIAGNOSIS — R51 Headache: Secondary | ICD-10-CM

## 2011-05-15 DIAGNOSIS — R519 Headache, unspecified: Secondary | ICD-10-CM

## 2011-05-15 LAB — URINALYSIS, ROUTINE W REFLEX MICROSCOPIC
Bilirubin Urine: NEGATIVE
Glucose, UA: NEGATIVE mg/dL
Ketones, ur: NEGATIVE mg/dL
Nitrite: NEGATIVE
Protein, ur: NEGATIVE mg/dL
Specific Gravity, Urine: 1.025 (ref 1.005–1.030)
Urobilinogen, UA: 0.2 mg/dL (ref 0.0–1.0)
pH: 5.5 (ref 5.0–8.0)

## 2011-05-15 LAB — URINE MICROSCOPIC-ADD ON

## 2011-05-15 MED ORDER — BUTALBITAL-APAP-CAFFEINE 50-325-40 MG PO TABS: 1.0000 | ORAL_TABLET | Freq: Four times a day (QID) | ORAL | Status: DC | PRN

## 2011-05-15 MED ORDER — BUTALBITAL-APAP-CAFFEINE 50-325-40 MG PO TABS
2.0000 | ORAL_TABLET | Freq: Once | ORAL | Status: AC
  Administered 2011-05-15: 2 via ORAL
  Filled 2011-05-15: qty 2

## 2011-05-15 NOTE — Progress Notes (Signed)
Has been having ongoing, almost daily headaches.  Denies hx of migraines.  Feeling nauseated. Neck and back of head is hurting.

## 2011-05-15 NOTE — ED Provider Notes (Signed)
History   Pt presents today c/o HA. She states she has a hx of migraine HA and tylenol is not helping. She states the pain makes her nauseated. She reports having HA about every other day. She denies blurry vision, or other neurological deficits. She denies abd pain, vag dc, bleeding, or any other sx at this time.  Chief Complaint  Patient presents with  . Headache   HPI  OB History    Grav Para Term Preterm Abortions TAB SAB Ect Mult Living   5 4 4       4       Past Medical History  Diagnosis Date  . Kidney stones   . Gall stones     No past surgical history on file.  Family History  Problem Relation Age of Onset  . Cancer Father     History  Substance Use Topics  . Smoking status: Never Smoker   . Smokeless tobacco: Never Used  . Alcohol Use: No    Allergies: No Known Allergies  Prescriptions prior to admission  Medication Sig Dispense Refill  . docusate sodium 100 MG CAPS Take 100 mg by mouth 2 (two) times daily.  30 capsule  3  . oxyCODONE-acetaminophen (PERCOCET) 5-325 MG per tablet Take 1-2 tablets by mouth every 4 (four) hours as needed.  30 tablet  0  . Prenatal Vit-Fe Fumarate-FA (PRENATAL MULTIVITAMIN) TABS Take 1 tablet by mouth daily.  30 tablet  6    Review of Systems  Constitutional: Negative for fever and chills.  HENT: Negative for hearing loss and tinnitus.   Eyes: Positive for photophobia. Negative for blurred vision, double vision, pain, discharge and redness.  Respiratory: Negative for cough, hemoptysis, sputum production, shortness of breath and wheezing.   Cardiovascular: Negative for chest pain and palpitations.  Gastrointestinal: Positive for nausea and vomiting. Negative for abdominal pain, diarrhea and constipation.  Genitourinary: Negative for dysuria, urgency, frequency and hematuria.  Neurological: Positive for headaches. Negative for dizziness.  Psychiatric/Behavioral: Negative for depression and suicidal ideas.   Physical Exam    Blood pressure 98/65, pulse 81, temperature 97.4 F (36.3 C), temperature source Oral, resp. rate 20, height 5\' 4"  (1.626 m), weight 196 lb (88.905 kg), SpO2 100.00%.  Physical Exam  Nursing note and vitals reviewed. Constitutional: She is oriented to person, place, and time. She appears well-developed and well-nourished. No distress.  HENT:  Head: Normocephalic and atraumatic.  Eyes: Conjunctivae, EOM and lids are normal. Pupils are equal, round, and reactive to light. Right eye exhibits no discharge. Left eye exhibits no discharge. No scleral icterus.  Fundoscopic exam:      The right eye shows no exudate, no hemorrhage and no papilledema. The right eye shows red reflex.      The left eye shows no exudate, no hemorrhage and no papilledema. The left eye shows red reflex. Cardiovascular: Normal rate, regular rhythm and normal heart sounds.  Exam reveals no gallop and no friction rub.   No murmur heard. Respiratory: Effort normal and breath sounds normal. No respiratory distress. She has no wheezes. She has no rales. She exhibits no tenderness.  GI: Soft. She exhibits no distension and no mass. There is no tenderness. There is no rebound and no guarding.  Neurological: She is alert and oriented to person, place, and time. No cranial nerve deficit. Coordination normal.  Skin: Skin is warm and dry. She is not diaphoretic.  Psychiatric: She has a normal mood and affect. Her behavior is normal.  Judgment and thought content normal.    MAU Course  Procedures  Results for orders placed during the hospital encounter of 05/15/11 (from the past 72 hour(s))  URINALYSIS, ROUTINE W REFLEX MICROSCOPIC     Status: Abnormal   Collection Time   05/15/11  9:37 AM      Component Value Range Comment   Color, Urine YELLOW  YELLOW     APPearance CLEAR  CLEAR     Specific Gravity, Urine 1.025  1.005 - 1.030     pH 5.5  5.0 - 8.0     Glucose, UA NEGATIVE  NEGATIVE (mg/dL)    Hgb urine dipstick TRACE (*)  NEGATIVE     Bilirubin Urine NEGATIVE  NEGATIVE     Ketones, ur NEGATIVE  NEGATIVE (mg/dL)    Protein, ur NEGATIVE  NEGATIVE (mg/dL)    Urobilinogen, UA 0.2  0.0 - 1.0 (mg/dL)    Nitrite NEGATIVE  NEGATIVE     Leukocytes, UA SMALL (*) NEGATIVE    URINE MICROSCOPIC-ADD ON     Status: Normal   Collection Time   05/15/11  9:37 AM      Component Value Range Comment   Squamous Epithelial / LPF RARE  RARE     WBC, UA 21-50  <3 (WBC/hpf)    RBC / HPF 3-6  <3 (RBC/hpf)    Urine sent for culture.  Pt HA has completely resolved following fioricet.  Assessment and Plan  HA: pt likely with tension HA. Will give Rx for fioricet. She has a f/u appt scheduled with Dr. Gaynell Face. Discussed diet, activity, risks, and precautions.  Clinton Gallant. Oluwanifemi Susman III, DrHSc, MPAS, PA-C  05/15/2011, 9:26 AM   Henrietta Hoover, PA 05/15/11 1029

## 2011-05-15 NOTE — ED Provider Notes (Signed)
Attestation of Attending Supervision of Advanced Practitioner: Evaluation and management procedures were performed by the PA/NP/CNM/OB Fellow under my supervision/collaboration. Chart reviewed, and agree with management and plan.  Jaynie Collins, M.D. 05/15/2011 11:34 AM

## 2011-05-15 NOTE — Discharge Instructions (Signed)
Headache °Headaches are caused by many different problems. Most commonly, headache is caused by muscle tension from an injury, fatigue, or emotional upset. Excessive muscle contractions in the scalp and neck result in a headache that often feels like a tight band around the head. Tension headaches often have areas of tenderness over the scalp and the back of the neck. These headaches may last for hours, days, or longer, and some may contribute to migraines in those who have migraine problems. °Migraines usually cause a throbbing headache, which is made worse by activity. Sometimes only one side of the head hurts. Nausea, vomiting, eye pain, and avoidance of food are common with migraines. Visual symptoms such as light sensitivity, blind spots, or flashing lights may also occur. Loud noises may worsen migraine headaches. Many factors may cause migraine headaches: °· Emotional stress, lack of sleep, and menstrual periods.  °· Alcohol and some drugs (such as birth control pills).  °· Diet factors (fasting, caffeine, food preservatives, chocolate).  °· Environmental factors (weather changes, bright lights, odors, smoke).  °Other causes of headaches include minor injuries to the head. Arthritis in the neck; problems with the jaw, eyes, ears, or nose are also causes of headaches. Allergies, drugs, alcohol, and exposure to smoke can also cause moderate headaches. Rebound headaches can occur if someone uses pain medications for a long period of time and then stops. Less commonly, blood vessel problems in the neck and brain (including stroke) can cause various types of headache. °Treatment of headaches includes medicines for pain and relaxation. Ice packs or heat applied to the back of the head and neck help some people. Massaging the shoulders, neck and scalp are often very useful. Relaxation techniques and stretching can help prevent these headaches. Avoid alcohol and cigarette smoking as these tend to make headaches  worse. Please see your caregiver if your headache is not better in 2 days.  °SEEK IMMEDIATE MEDICAL CARE IF:  °· You develop a high fever, chills, or repeated vomiting.  °· You faint or have difficulty with vision.  °· You develop unusual numbness or weakness of your arms or legs.  °· Relief of pain is inadequate with medication, or you develop severe pain.  °· You develop confusion, or neck stiffness.  °· You have a worsening of a headache or do not obtain relief.  °Document Released: 03/06/2005 Document Revised: 11/16/2010 Document Reviewed: 08/30/2006 °[employer]® Patient Information ©2012 [employer]. °

## 2011-10-16 ENCOUNTER — Encounter: Payer: Self-pay | Admitting: Advanced Practice Midwife

## 2011-10-16 ENCOUNTER — Other Ambulatory Visit (HOSPITAL_COMMUNITY)
Admission: RE | Admit: 2011-10-16 | Discharge: 2011-10-16 | Disposition: A | Discharge status: Home / Self Care | Payer: Medicaid Other | Source: Ambulatory Visit | Attending: Advanced Practice Midwife | Admitting: Advanced Practice Midwife

## 2011-10-16 ENCOUNTER — Ambulatory Visit (INDEPENDENT_AMBULATORY_CARE_PROVIDER_SITE_OTHER): Payer: Medicaid Other | Admitting: Advanced Practice Midwife

## 2011-10-16 VITALS — BP 106/74 | Temp 98.4°F | Wt 223.9 lb

## 2011-10-16 DIAGNOSIS — O0930 Supervision of pregnancy with insufficient antenatal care, unspecified trimester: Secondary | ICD-10-CM

## 2011-10-16 DIAGNOSIS — O09299 Supervision of pregnancy with other poor reproductive or obstetric history, unspecified trimester: Secondary | ICD-10-CM

## 2011-10-16 DIAGNOSIS — Z3490 Encounter for supervision of normal pregnancy, unspecified, unspecified trimester: Secondary | ICD-10-CM | POA: Insufficient documentation

## 2011-10-16 DIAGNOSIS — Z01419 Encounter for gynecological examination (general) (routine) without abnormal findings: Secondary | ICD-10-CM | POA: Insufficient documentation

## 2011-10-16 DIAGNOSIS — Z113 Encounter for screening for infections with a predominantly sexual mode of transmission: Secondary | ICD-10-CM | POA: Insufficient documentation

## 2011-10-16 LAB — POCT URINALYSIS DIP (DEVICE)
Bilirubin Urine: NEGATIVE
Glucose, UA: NEGATIVE mg/dL
Ketones, ur: NEGATIVE mg/dL
Nitrite: NEGATIVE
Protein, ur: 30 mg/dL — AB
Specific Gravity, Urine: 1.015 (ref 1.005–1.030)
Urobilinogen, UA: 0.2 mg/dL (ref 0.0–1.0)
pH: 7 (ref 5.0–8.0)

## 2011-10-16 LAB — HIV ANTIBODY (ROUTINE TESTING W REFLEX): HIV: NONREACTIVE

## 2011-10-16 LAB — OB RESULTS CONSOLE GBS: GBS: NEGATIVE

## 2011-10-16 LAB — HEMOGLOBIN A1C
Hgb A1c MFr Bld: 6.1 % — ABNORMAL HIGH
Mean Plasma Glucose: 128 mg/dL — ABNORMAL HIGH

## 2011-10-16 NOTE — Patient Instructions (Signed)
Fetal Movement Counts Patient Name: __________________________________________________ Patient Due Date: ____________________ Kick counts is highly recommended in high risk pregnancies, but it is a good idea for every pregnant woman to do. Start counting fetal movements at 28 weeks of the pregnancy. Fetal movements increase after eating a full meal or eating or drinking something sweet (the blood sugar is higher). It is also important to drink plenty of fluids (well hydrated) before doing the count. Lie on your left side because it helps with the circulation or you can sit in a comfortable chair with your arms over your belly (abdomen) with no distractions around you. DOING THE COUNT  Try to do the count the same time of day each time you do it.   Mark the day and time, then see how long it takes for you to feel 10 movements (kicks, flutters, swishes, rolls). You should have at least 10 movements within 2 hours. You will most likely feel 10 movements in much less than 2 hours. If you do not, wait an hour and count again. After a couple of days you will see a pattern.   What you are looking for is a change in the pattern or not enough counts in 2 hours. Is it taking longer in time to reach 10 movements?  SEEK MEDICAL CARE IF:  You feel less than 10 counts in 2 hours. Tried twice.   No movement in one hour.   The pattern is changing or taking longer each day to reach 10 counts in 2 hours.   You feel the baby is not moving as it usually does.  Date: ____________ Movements: ____________ Start time: ____________ Finish time: ____________  Date: ____________ Movements: ____________ Start time: ____________ Finish time: ____________ Date: ____________ Movements: ____________ Start time: ____________ Finish time: ____________ Date: ____________ Movements: ____________ Start time: ____________ Finish time: ____________ Date: ____________ Movements: ____________ Start time: ____________ Finish time:  ____________ Date: ____________ Movements: ____________ Start time: ____________ Finish time: ____________ Date: ____________ Movements: ____________ Start time: ____________ Finish time: ____________ Date: ____________ Movements: ____________ Start time: ____________ Finish time: ____________  Date: ____________ Movements: ____________ Start time: ____________ Finish time: ____________ Date: ____________ Movements: ____________ Start time: ____________ Finish time: ____________ Date: ____________ Movements: ____________ Start time: ____________ Finish time: ____________ Date: ____________ Movements: ____________ Start time: ____________ Finish time: ____________ Date: ____________ Movements: ____________ Start time: ____________ Finish time: ____________ Date: ____________ Movements: ____________ Start time: ____________ Finish time: ____________ Date: ____________ Movements: ____________ Start time: ____________ Finish time: ____________  Date: ____________ Movements: ____________ Start time: ____________ Finish time: ____________ Date: ____________ Movements: ____________ Start time: ____________ Finish time: ____________ Date: ____________ Movements: ____________ Start time: ____________ Finish time: ____________ Date: ____________ Movements: ____________ Start time: ____________ Finish time: ____________ Date: ____________ Movements: ____________ Start time: ____________ Finish time: ____________ Date: ____________ Movements: ____________ Start time: ____________ Finish time: ____________ Date: ____________ Movements: ____________ Start time: ____________ Finish time: ____________  Date: ____________ Movements: ____________ Start time: ____________ Finish time: ____________ Date: ____________ Movements: ____________ Start time: ____________ Finish time: ____________ Date: ____________ Movements: ____________ Start time: ____________ Finish time: ____________ Date: ____________ Movements:  ____________ Start time: ____________ Finish time: ____________ Date: ____________ Movements: ____________ Start time: ____________ Finish time: ____________ Date: ____________ Movements: ____________ Start time: ____________ Finish time: ____________ Date: ____________ Movements: ____________ Start time: ____________ Finish time: ____________  Date: ____________ Movements: ____________ Start time: ____________ Finish time: ____________ Date: ____________ Movements: ____________ Start time: ____________ Finish time: ____________ Date: ____________ Movements: ____________ Start time:   ____________ Doreatha Martin time: ____________ Date: ____________ Movements: ____________ Start time: ____________ Doreatha Martin time: ____________ Date: ____________ Movements: ____________ Start time: ____________ Doreatha Martin time: ____________ Date: ____________ Movements: ____________ Start time: ____________ Doreatha Martin time: ____________ Date: ____________ Movements: ____________ Start time: ____________ Doreatha Martin time: ____________  Date: ____________ Movements: ____________ Start time: ____________ Doreatha Martin time: ____________ Date: ____________ Movements: ____________ Start time: ____________ Doreatha Martin time: ____________ Date: ____________ Movements: ____________ Start time: ____________ Doreatha Martin time: ____________ Date: ____________ Movements: ____________ Start time: ____________ Doreatha Martin time: ____________ Date: ____________ Movements: ____________ Start time: ____________ Doreatha Martin time: ____________ Date: ____________ Movements: ____________ Start time: ____________ Doreatha Martin time: ____________ Date: ____________ Movements: ____________ Start time: ____________ Doreatha Martin time: ____________  Date: ____________ Movements: ____________ Start time: ____________ Doreatha Martin time: ____________ Date: ____________ Movements: ____________ Start time: ____________ Doreatha Martin time: ____________ Date: ____________ Movements: ____________ Start time: ____________ Doreatha Martin  time: ____________ Date: ____________ Movements: ____________ Start time: ____________ Doreatha Martin time: ____________ Date: ____________ Movements: ____________ Start time: ____________ Doreatha Martin time: ____________ Date: ____________ Movements: ____________ Start time: ____________ Doreatha Martin time: ____________ Date: ____________ Movements: ____________ Start time: ____________ Doreatha Martin time: ____________  Date: ____________ Movements: ____________ Start time: ____________ Doreatha Martin time: ____________ Date: ____________ Movements: ____________ Start time: ____________ Doreatha Martin time: ____________ Date: ____________ Movements: ____________ Start time: ____________ Doreatha Martin time: ____________ Date: ____________ Movements: ____________ Start time: ____________ Doreatha Martin time: ____________ Date: ____________ Movements: ____________ Start time: ____________ Doreatha Martin time: ____________ Date: ____________ Movements: ____________ Start time: ____________ Doreatha Martin time: ____________ Document Released: 04/05/2006 Document Revised: 02/23/2011 Document Reviewed: 10/06/2008 [employer] Patient Information 2012 [employer]. Normal Labor and Delivery Your caregiver must first be sure you are in labor. Signs of labor include:  You may pass what is called "the mucus plug" before labor begins. This is a small amount of blood stained mucus.   Regular uterine contractions.   The time between contractions get closer together.   The discomfort and pain gradually gets more intense.   Pains are mostly located in the back.   Pains get worse when walking.   The cervix (the opening of the uterus becomes thinner (begins to efface) and opens up (dilates).  Once you are in labor and admitted into the hospital or care center, your caregiver will do the following:  A complete physical examination.   Check your vital signs (blood pressure, pulse, temperature and the fetal heart rate).   Do a vaginal examination (using a sterile glove and  lubricant) to determine:   The position (presentation) of the baby (head [vertex] or buttock first).   The level (station) of the baby's head in the birth canal.   The effacement and dilatation of the cervix.   You may have your pubic hair shaved and be given an enema depending on your caregiver and the circumstance.   An electronic monitor is usually placed on your abdomen. The monitor follows the length and intensity of the contractions, as well as the baby's heart rate.   Usually, your caregiver will insert an IV in your arm with a bottle of sugar water. This is done as a precaution so that medications can be given to you quickly during labor or delivery.  NORMAL LABOR AND DELIVERY IS DIVIDED UP INTO 3 STAGES: First Stage This is when regular contractions begin and the cervix begins to efface and dilate. This stage can last from 3 to 15 hours. The end of the first stage is when the cervix is 100% effaced and 10 centimeters dilated. Pain medications may be given by   Injection (morphine,  demerol, etc.)   Regional anesthesia (spinal, caudal or epidural, anesthetics given in different locations of the spine). Paracervical pain medication may be given, which is an injection of and anesthetic on each side of the cervix.  A pregnant woman may request to have "Natural Childbirth" which is not to have any medications or anesthesia during her labor and delivery. Second Stage This is when the baby comes down through the birth canal (vagina) and is born. This can take 1 to 4 hours. As the baby's head comes down through the birth canal, you may feel like you are going to have a bowel movement. You will get the urge to bear down and push until the baby is delivered. As the baby's head is being delivered, the caregiver will decide if an episiotomy (a cut in the perineum and vagina area) is needed to prevent tearing of the tissue in this area. The episiotomy is sewn up after the delivery of the baby and  placenta. Sometimes a mask with nitrous oxide is given for the mother to breath during the delivery of the baby to help if there is too much pain. The end of Stage 2 is when the baby is fully delivered. Then when the umbilical cord stops pulsating it is clamped and cut. Third Stage The third stage begins after the baby is completely delivered and ends after the placenta (afterbirth) is delivered. This usually takes 5 to 30 minutes. After the placenta is delivered, a medication is given either by intravenous or injection to help contract the uterus and prevent bleeding. The third stage is not painful and pain medication is usually not necessary. If an episiotomy was done, it is repaired at this time. After the delivery, the mother is watched and monitored closely for 1 to 2 hours to make sure there is no postpartum bleeding (hemorrhage). If there is a lot of bleeding, medication is given to contract the uterus and stop the bleeding. Document Released: 12/14/2007 Document Revised: 02/23/2011 Document Reviewed: 12/14/2007 [facility] Patient Information 2012 [place], [place].  Mirena IUD Levonorgestrel intrauterine device (IUD) What is this medicine? LEVONORGESTREL IUD (LEE voe nor jes trel) is a contraceptive (birth control) device. It is used to prevent pregnancy and to treat heavy bleeding that occurs during your period. It can be used for up to 5 years. This medicine may be used for other purposes; ask your health care provider or pharmacist if you have questions. What should I tell my health care provider before I take this medicine? They need to know if you have any of these conditions: -abnormal Pap smear -cancer of the breast, uterus, or cervix -diabetes -endometritis -genital or pelvic infection now or in the past -have more than one sexual partner or your partner has more than one partner -heart disease -history of an ectopic or tubal pregnancy -immune system problems -IUD in place -liver  disease or tumor -problems with blood clots or take blood-thinners -use intravenous drugs -uterus of unusual shape -vaginal bleeding that has not been explained -an unusual or allergic reaction to levonorgestrel, other hormones, silicone, or polyethylene, medicines, foods, dyes, or preservatives -pregnant or trying to get pregnant -breast-feeding How should I use this medicine? This device is placed inside the uterus by a health care professional. Talk to your pediatrician regarding the use of this medicine in children. Special care may be needed. Overdosage: If you think you have taken too much of this medicine contact a poison control center or emergency room at once. NOTE:  This medicine is only for you. Do not share this medicine with others. What if I miss a dose? This does not apply. What may interact with this medicine? Do not take this medicine with any of the following medications: -amprenavir -bosentan -fosamprenavir This medicine may also interact with the following medications: -aprepitant -barbiturate medicines for inducing sleep or treating seizures -bexarotene -griseofulvin -medicines to treat seizures like carbamazepine, ethotoin, felbamate, oxcarbazepine, phenytoin, topiramate -modafinil -pioglitazone -rifabutin -rifampin -rifapentine -some medicines to treat HIV infection like atazanavir, indinavir, lopinavir, nelfinavir, tipranavir, ritonavir -St. John's wort -warfarin This list may not describe all possible interactions. Give your health care provider a list of all the medicines, herbs, non-prescription drugs, or dietary supplements you use. Also tell them if you smoke, drink alcohol, or use illegal drugs. Some items may interact with your medicine. What should I watch for while using this medicine? Visit your doctor or health care professional for regular check ups. See your doctor if you or your partner has sexual contact with others, becomes HIV positive, or  gets a sexual transmitted disease. This product does not protect you against HIV infection (AIDS) or other sexually transmitted diseases. You can check the placement of the IUD yourself by reaching up to the top of your vagina with clean fingers to feel the threads. Do not pull on the threads. It is a good habit to check placement after each menstrual period. Call your doctor right away if you feel more of the IUD than just the threads or if you cannot feel the threads at all. The IUD may come out by itself. You may become pregnant if the device comes out. If you notice that the IUD has come out use a backup birth control method like condoms and call your health care provider. Using tampons will not change the position of the IUD and are okay to use during your period. What side effects may I notice from receiving this medicine? Side effects that you should report to your doctor or health care professional as soon as possible: -allergic reactions like skin rash, itching or hives, swelling of the face, lips, or tongue -fever, flu-like symptoms -genital sores -high blood pressure -no menstrual period for 6 weeks during use -pain, swelling, warmth in the leg -pelvic pain or tenderness -severe or sudden headache -signs of pregnancy -stomach cramping -sudden shortness of breath -trouble with balance, talking, or walking -unusual vaginal bleeding, discharge -yellowing of the eyes or skin Side effects that usually do not require medical attention (report to your doctor or health care professional if they continue or are bothersome): -acne -breast pain -change in sex drive or performance -changes in weight -cramping, dizziness, or faintness while the device is being inserted -headache -irregular menstrual bleeding within first 3 to 6 months of use -nausea This list may not describe all possible side effects. Call your doctor for medical advice about side effects. You may report side effects to  FDA at 1-800-FDA-1088. Where should I keep my medicine? This does not apply. NOTE: This sheet is a summary. It may not cover all possible information. If you have questions about this medicine, talk to your doctor, pharmacist, or health care provider.  2012, [employer]. (03/27/2008 6:39:08 PM)

## 2011-10-16 NOTE — Progress Notes (Signed)
Informal [place] for presentation done- vtx , +cardiac, Fetal breathing movements seen x30 sec.

## 2011-10-16 NOTE — Progress Notes (Signed)
Pulse: 89 1hr gtt due at 0930

## 2011-10-16 NOTE — Progress Notes (Signed)
  Subjective:    Caitlin Mejia is being seen today for her first obstetrical visit.  This is not a planned pregnancy. She is at [redacted]w[redacted]d gestation. Her obstetrical history is significant for Hx GDM, Hx Macrosomia, Hx mild shoulder dystrocia, late to care. Patient does intend to breast feed. Pregnancy history fully reviewed.  Patient reports backache, no bleeding, no leaking and occasional contractions.  Review of Systems:   Review of Systems: Otherwise negative  Objective:     BP 106/74  Temp 98.4 F (36.9 C)  Wt 223 lb 14.4 oz (101.56 kg) Physical Exam Physical Examination: General appearance - alert, well appearing, and in no distress and oriented to person, place, and time Mental status - alert, oriented to person, place, and time, normal mood, behavior, speech, dress, motor activity, and thought processes Neck - supple, no significant adenopathy Lymphatics - no palpable lymphadenopathy Chest - clear to auscultation, no wheezes, rales or rhonchi, symmetric air entry Heart - normal rate, regular rhythm, normal S1, S2, no murmurs, rubs, clicks or gallops Abdomen - soft, nontender, nondistended, no masses or organomegaly 41 week fundal height Pelvic - normal external genitalia, vulva, vagina, cervix, uterus and adnexa, PAP: Pap smear done today, GC/CT, GBS Neurological - alert, oriented, normal speech, no focal findings or movement disorder noted, DTR's normal and symmetric Extremities - no edema, redness or tenderness in the calves or thighs fExam Vertex by [place]  Assessment:    Pregnancy: [id number] Patient Active Problem List  Diagnosis  . History of shoulder dystocia in prior pregnancy, currently pregnant  . Supervision of normal pregnancy  . Late prenatal care  . History of gestational diabetes in prior pregnancy, currently pregnant       1. Late prenatal care  HIV Antibody ( Reflex), Cytology - PAP, RPR, Culture, OB Urine, Culture, beta strep (group b only), Glucose  Tolerance, 1 HR (50g)  2. Supervision of normal pregnancy  HIV Antibody ( Reflex), Cytology - PAP, RPR, Culture, OB Urine, Culture, beta strep (group b only), Glucose Tolerance, 1 HR (50g)  3. History of gestational diabetes in prior pregnancy, currently pregnant  HgB A1c, Glucose Tolerance, 1 HR (50g)    Plan:     Initial labs drawn. Prenatal vitamins. Problem list reviewed and updated. AFP3 discussed: Too late. Role of ultrasound in pregnancy discussed; fetal survey: Normal  . Amniocentesis discussed: too late. Follow up in 4 days for NST. 50% of 45 min visit spent on counseling and coordination of care.  Unfavorable cervix. No medical indication for IOL. Start antenatal testing at 40.4 weeks and IOL at 41 or ASAP for elevated 1 hour GTT.   Dorathy Kinsman 10/16/2011

## 2011-10-17 LAB — GLUCOSE TOLERANCE, 1 HOUR (50G) W/O FASTING: Glucose, 1 Hour GTT: 140 mg/dL (ref 70–140)

## 2011-10-17 LAB — RPR

## 2011-10-18 LAB — CULTURE, OB URINE: Colony Count: >=100000

## 2011-10-19 ENCOUNTER — Ambulatory Visit (INDEPENDENT_AMBULATORY_CARE_PROVIDER_SITE_OTHER): Payer: Medicaid Other | Admitting: *Deleted

## 2011-10-19 VITALS — BP 117/71 | Wt 224.9 lb

## 2011-10-19 DIAGNOSIS — O480 Post-term pregnancy: Secondary | ICD-10-CM

## 2011-10-19 LAB — CULTURE, BETA STREP (GROUP B ONLY)

## 2011-10-19 NOTE — Progress Notes (Signed)
P=[phone] 

## 2011-10-19 NOTE — Progress Notes (Signed)
NST performed today was reviewed and was found to be reactive.  AFI 8.6 cm. Continue recommended antenatal testing and prenatal care.

## 2011-10-20 ENCOUNTER — Inpatient Hospital Stay (HOSPITAL_COMMUNITY): Payer: Medicaid Other | Admitting: Anesthesiology

## 2011-10-20 ENCOUNTER — Encounter (HOSPITAL_COMMUNITY): Payer: Self-pay | Admitting: *Deleted

## 2011-10-20 ENCOUNTER — Inpatient Hospital Stay (HOSPITAL_COMMUNITY)
Admission: AD | Admit: 2011-10-20 | Discharge: 2011-10-21 | DRG: 775 | Disposition: A | Discharge status: Home / Self Care | Payer: Medicaid Other | Source: Ambulatory Visit | Attending: Obstetrics & Gynecology | Admitting: Obstetrics & Gynecology

## 2011-10-20 ENCOUNTER — Encounter (HOSPITAL_COMMUNITY): Payer: Self-pay | Admitting: Anesthesiology

## 2011-10-20 LAB — RAPID URINE DRUG SCREEN, HOSP PERFORMED
Amphetamines: NOT DETECTED
Barbiturates: NOT DETECTED
Benzodiazepines: NOT DETECTED
Cocaine: NOT DETECTED
Opiates: NOT DETECTED
Tetrahydrocannabinol: NOT DETECTED

## 2011-10-20 LAB — GLUCOSE, CAPILLARY: Glucose-Capillary: 89 mg/dL (ref 70–99)

## 2011-10-20 LAB — CBC
HCT: 36.8 % (ref 36.0–46.0)
Hemoglobin: 12.1 g/dL (ref 12.0–15.0)
MCH: 27.8 pg (ref 26.0–34.0)
MCHC: 32.9 g/dL (ref 30.0–36.0)
MCV: 84.4 fL (ref 78.0–100.0)
Platelets: 168 10*3/uL (ref 150–400)
RBC: 4.36 MIL/uL (ref 3.87–5.11)
RDW: 13.8 % (ref 11.5–15.5)
WBC: 7.9 10*3/uL (ref 4.0–10.5)

## 2011-10-20 LAB — TYPE AND SCREEN
ABO/RH(D): B POS
Antibody Screen: NEGATIVE

## 2011-10-20 LAB — RPR: RPR Ser Ql: NONREACTIVE

## 2011-10-20 MED ORDER — IBUPROFEN 600 MG PO TABS
600.0000 mg | ORAL_TABLET | Freq: Four times a day (QID) | ORAL | Status: DC | PRN
  Administered 2011-10-20: 600 mg via ORAL
  Filled 2011-10-20: qty 1

## 2011-10-20 MED ORDER — EPHEDRINE 5 MG/ML INJ
10.0000 mg | INTRAVENOUS | Status: DC | PRN
  Filled 2011-10-20: qty 4

## 2011-10-20 MED ORDER — OXYTOCIN BOLUS FROM INFUSION
250.0000 mL | Freq: Once | INTRAVENOUS | Status: AC
  Administered 2011-10-20: 250 mL via INTRAVENOUS
  Filled 2011-10-20: qty 500

## 2011-10-20 MED ORDER — LACTATED RINGERS IV SOLN
INTRAVENOUS | Status: DC
  Administered 2011-10-20: 125 mL/h via INTRAVENOUS

## 2011-10-20 MED ORDER — DIBUCAINE 1 % RE OINT: 1.0000 "application " | TOPICAL_OINTMENT | RECTAL | Status: DC | PRN

## 2011-10-20 MED ORDER — LIDOCAINE HCL (PF) 1 % IJ SOLN
INTRAMUSCULAR | Status: DC | PRN
  Administered 2011-10-20 (×2): 5 mL

## 2011-10-20 MED ORDER — ONDANSETRON HCL 4 MG/2ML IJ SOLN: 4.0000 mg | INTRAMUSCULAR | Status: DC | PRN

## 2011-10-20 MED ORDER — WITCH HAZEL-GLYCERIN EX PADS: 1.0000 "application " | MEDICATED_PAD | CUTANEOUS | Status: DC | PRN

## 2011-10-20 MED ORDER — PRENATAL MULTIVITAMIN CH
1.0000 | ORAL_TABLET | Freq: Every day | ORAL | Status: DC
  Administered 2011-10-20 – 2011-10-21 (×2): 1 via ORAL
  Filled 2011-10-20 (×2): qty 1

## 2011-10-20 MED ORDER — DIPHENHYDRAMINE HCL 50 MG/ML IJ SOLN: 12.5000 mg | INTRAMUSCULAR | Status: DC | PRN

## 2011-10-20 MED ORDER — OXYCODONE-ACETAMINOPHEN 5-325 MG PO TABS
1.0000 | ORAL_TABLET | ORAL | Status: DC | PRN
  Administered 2011-10-20: 1 via ORAL
  Filled 2011-10-20: qty 1

## 2011-10-20 MED ORDER — OXYTOCIN 40 UNITS IN LACTATED RINGERS INFUSION - SIMPLE MED
62.5000 mL/h | Freq: Once | INTRAVENOUS | Status: AC
  Administered 2011-10-20: 62.5 mL/h via INTRAVENOUS
  Filled 2011-10-20: qty 1000

## 2011-10-20 MED ORDER — LACTATED RINGERS IV SOLN
500.0000 mL | INTRAVENOUS | Status: DC | PRN
  Administered 2011-10-20: 1000 mL via INTRAVENOUS

## 2011-10-20 MED ORDER — FENTANYL CITRATE 0.05 MG/ML IJ SOLN
50.0000 ug | INTRAMUSCULAR | Status: DC | PRN
  Administered 2011-10-20: 50 ug via INTRAVENOUS
  Filled 2011-10-20: qty 2

## 2011-10-20 MED ORDER — FLEET ENEMA 7-19 GM/118ML RE ENEM: 1.0000 | ENEMA | RECTAL | Status: DC | PRN

## 2011-10-20 MED ORDER — FENTANYL 2.5 MCG/ML BUPIVACAINE 1/10 % EPIDURAL INFUSION (WH - ANES)
14.0000 mL/h | INTRAMUSCULAR | Status: DC
Start: 2011-10-20 — End: 2011-10-20
  Filled 2011-10-20: qty 60

## 2011-10-20 MED ORDER — HYDROXYZINE HCL 50 MG/ML IM SOLN
50.0000 mg | Freq: Four times a day (QID) | INTRAMUSCULAR | Status: DC | PRN
  Filled 2011-10-20: qty 1

## 2011-10-20 MED ORDER — IBUPROFEN 600 MG PO TABS
600.0000 mg | ORAL_TABLET | Freq: Four times a day (QID) | ORAL | Status: DC
  Administered 2011-10-20 – 2011-10-21 (×4): 600 mg via ORAL
  Filled 2011-10-20 (×4): qty 1

## 2011-10-20 MED ORDER — LIDOCAINE HCL (PF) 1 % IJ SOLN
30.0000 mL | INTRAMUSCULAR | Status: DC | PRN
  Filled 2011-10-20: qty 30

## 2011-10-20 MED ORDER — ONDANSETRON HCL 4 MG PO TABS: 4.0000 mg | ORAL_TABLET | ORAL | Status: DC | PRN

## 2011-10-20 MED ORDER — EPHEDRINE 5 MG/ML INJ: 10.0000 mg | INTRAVENOUS | Status: DC | PRN

## 2011-10-20 MED ORDER — FENTANYL 2.5 MCG/ML BUPIVACAINE 1/10 % EPIDURAL INFUSION (WH - ANES)
INTRAMUSCULAR | Status: DC | PRN
  Administered 2011-10-20: 14 mL/h via EPIDURAL

## 2011-10-20 MED ORDER — ONDANSETRON HCL 4 MG/2ML IJ SOLN: 4.0000 mg | Freq: Four times a day (QID) | INTRAMUSCULAR | Status: DC | PRN

## 2011-10-20 MED ORDER — SIMETHICONE 80 MG PO CHEW: 80.0000 mg | CHEWABLE_TABLET | ORAL | Status: DC | PRN

## 2011-10-20 MED ORDER — LACTATED RINGERS IV SOLN: 500.0000 mL | Freq: Once | INTRAVENOUS | Status: DC

## 2011-10-20 MED ORDER — CITRIC ACID-SODIUM CITRATE 334-500 MG/5ML PO SOLN: 30.0000 mL | ORAL | Status: DC | PRN

## 2011-10-20 MED ORDER — PHENYLEPHRINE 40 MCG/ML (10ML) SYRINGE FOR IV PUSH (FOR BLOOD PRESSURE SUPPORT)
80.0000 ug | PREFILLED_SYRINGE | INTRAVENOUS | Status: DC | PRN
  Filled 2011-10-20: qty 5

## 2011-10-20 MED ORDER — LANOLIN HYDROUS EX OINT: TOPICAL_OINTMENT | CUTANEOUS | Status: DC | PRN

## 2011-10-20 MED ORDER — ZOLPIDEM TARTRATE 5 MG PO TABS
5.0000 mg | ORAL_TABLET | Freq: Every evening | ORAL | Status: DC | PRN
Start: 2011-10-20 — End: 2011-10-21

## 2011-10-20 MED ORDER — DIPHENHYDRAMINE HCL 25 MG PO CAPS
25.0000 mg | ORAL_CAPSULE | Freq: Four times a day (QID) | ORAL | Status: DC | PRN
Start: 2011-10-20 — End: 2011-10-21

## 2011-10-20 MED ORDER — SENNOSIDES-DOCUSATE SODIUM 8.6-50 MG PO TABS
2.0000 | ORAL_TABLET | Freq: Every day | ORAL | Status: DC
  Administered 2011-10-20: 2 via ORAL

## 2011-10-20 MED ORDER — BENZOCAINE-MENTHOL 20-0.5 % EX AERO: 1.0000 "application " | INHALATION_SPRAY | CUTANEOUS | Status: DC | PRN

## 2011-10-20 MED ORDER — TETANUS-DIPHTH-ACELL PERTUSSIS 5-2.5-18.5 LF-MCG/0.5 IM SUSP: 0.5000 mL | Freq: Once | INTRAMUSCULAR | Status: DC

## 2011-10-20 MED ORDER — ACETAMINOPHEN 325 MG PO TABS: 650.0000 mg | ORAL_TABLET | ORAL | Status: DC | PRN

## 2011-10-20 MED ORDER — PHENYLEPHRINE 40 MCG/ML (10ML) SYRINGE FOR IV PUSH (FOR BLOOD PRESSURE SUPPORT): 80.0000 ug | PREFILLED_SYRINGE | INTRAVENOUS | Status: DC | PRN

## 2011-10-20 MED ORDER — HYDROXYZINE HCL 50 MG PO TABS
50.0000 mg | ORAL_TABLET | Freq: Four times a day (QID) | ORAL | Status: DC | PRN
  Administered 2011-10-20: 50 mg via ORAL
  Filled 2011-10-20: qty 1

## 2011-10-20 NOTE — Anesthesia Procedure Notes (Signed)
Epidural Patient location during procedure: OB Start time: 10/20/2011 10:10 AM  Staffing Anesthesiologist: Brayton Caves R Performed by: anesthesiologist   Preanesthetic Checklist Completed: patient identified, site marked, surgical consent, pre-op evaluation, timeout performed, IV checked, risks and benefits discussed and monitors and equipment checked  Epidural Patient position: sitting Prep: site prepped and draped and DuraPrep Patient monitoring: continuous pulse ox and blood pressure Approach: midline Injection technique: LOR air  Needle:  Needle type: Tuohy  Needle gauge: 17 G Needle length: 9 cm Needle insertion depth: 8 cm Catheter type: closed end flexible Catheter size: 19 Gauge Catheter at skin depth: 13 cm Test dose: negative  Assessment Events: blood not aspirated, injection not painful, no injection resistance, negative IV test and no paresthesia  Additional Notes Patient identified.  Risk benefits discussed including failed block, incomplete pain control, headache, nerve damage, paralysis, blood pressure changes, nausea, vomiting, reactions to medication both toxic or allergic, and postpartum back pain.  Patient expressed understanding and wished to proceed.  All questions were answered.  Sterile technique used throughout procedure and epidural site dressed with sterile barrier dressing. No paresthesia or other complications noted.The patient did not experience any signs of intravascular injection such as tinnitus or metallic taste in mouth nor signs of intrathecal spread such as rapid motor block. Please see nursing notes for vital signs. I entered this note for Lewie Loron my partner because [employer] would not supply his name.

## 2011-10-20 NOTE — MAU Note (Signed)
HURT BAD AT 0430- VE IN CLINIC 1 CM.      DENIES HSV AND MRSA.

## 2011-10-20 NOTE — MAU Note (Signed)
Pt reports contractions since 0500 G5P4

## 2011-10-20 NOTE — H&P (Signed)
  Caitlin Mejia is a 26 y.o. female [phone] with IUP at [redacted]w[redacted]d presenting for active labor. Pt states she has been having regular, every 2-4 minutes contractions, associated with none vaginal bleeding, membranes are intact, with active fetal movement.   [facility] at [facility] since 40 wks  Prenatal History/Complications: Late care  Past Medical History: Past Medical History  Diagnosis Date  . Gall stones   . Kidney stones     Stones  . Pyelonephritis     Past Surgical History: Past Surgical History  Procedure Date  . Ureteral stent placement   . Wisdom teeth     Obstetrical History: OB History    Grav Para Term Preterm Abortions TAB SAB Ect Mult Living   5 4 4       4       Gynecological History: OB History    Grav Para Term Preterm Abortions TAB SAB Ect Mult Living   5 4 4       4       Social History: History   Social History  . Marital Status: Single    Spouse Name: N/A    Number of Children: N/A  . Years of Education: N/A   Social History Main Topics  . Smoking status: Never Smoker   . Smokeless tobacco: Never Used  . Alcohol Use: No  . Drug Use: No  . Sexually Active: Yes    Birth Control/ Protection: None   Other Topics Concern  . None   Social History Narrative  . None    Family History: Family History  Problem Relation Age of Onset  . Cancer Father     Allergies: No Known Allergies  Prescriptions prior to admission  Medication Sig Dispense Refill  . Prenatal Vit-Fe Fumarate-FA (PRENATAL MULTIVITAMIN) TABS Take 1 tablet by mouth daily.  30 tablet  6  . acetaminophen (TYLENOL) 500 MG tablet Take 500 mg by mouth every 6 (six) hours as needed. For headache.      . butalbital-acetaminophen-caffeine (FIORICET) 50-325-40 MG per tablet Take 1-2 tablets by mouth every 6 (six) hours as needed for headache.  30 tablet  0    Review of Systems - Negative except contractions   Blood pressure 127/75, pulse 82, temperature 98.5 F (36.9 C),  temperature source Oral, resp. rate 20, height 5\' 2"  (1.575 m), weight 101.606 kg (224 lb), SpO2 100.00%. General appearance: alert, cooperative and mild distress Lungs: clear to auscultation bilaterally Heart: regular rate and rhythm Abdomen: soft, non-tender; bowel sounds normal Extremities: Homans sign is negative, no sign of DVT DTR's 2+ Presentation: cephalic Fetal monitoringBaseline: 150 bpm, avg LTV, + accels, no decels Uterine activity q 2-4 minutes Dilation: 4.5 Effacement (%): 80 Station: -2 Exam by:: KRISTEN, RN   Prenatal labs: ABO, Rh:  B+ Antibody:  neg Rubella:  immune RPR: NON REAC (07/29 0931)  HBsAg: NEGATIVE (02/22 1508)  HIV: NON REACTIVE (07/29 0931)  GBS:   negative 1 hr Glucola 140 Genetic screening too late Anatomy US normal    Assessment: Caitlin Mejia is a 26 y.o. [id number] with an IUP at [redacted]w[redacted]d presenting for active labor  Plan: Expectant management   CRESENZO-DISHMAN,Merion Grimaldo 10/20/2011, 7:38 AM

## 2011-10-20 NOTE — H&P (Signed)
Attestation of Attending Supervision of Advanced Practitioner (CNM/NP): Evaluation and management procedures were performed by the Advanced Practitioner under my supervision and collaboration.  I have reviewed the Advanced Practitioner's note and chart, and I agree with the management and plan.  Sheila Gervasi, MD, FACOG Attending Obstetrician & Gynecologist Faculty Practice, [facility], [place]  

## 2011-10-20 NOTE — Anesthesia Preprocedure Evaluation (Signed)
Anesthesia Evaluation  Patient identified by MRN, date of birth, ID band Patient awake    Reviewed: Allergy & Precautions, H&P , Patient's Chart, lab work & pertinent test results  Airway Mallampati: III TM Distance: >3 FB Neck ROM: full    Dental No notable dental hx.    Pulmonary neg pulmonary ROS,  breath sounds clear to auscultation  Pulmonary exam normal       Cardiovascular negative cardio ROS  Rhythm:regular Rate:Normal     Neuro/Psych negative neurological ROS  negative psych ROS   GI/Hepatic negative GI ROS, Neg liver ROS,   Endo/Other  negative endocrine ROSMorbid obesity  Renal/GU negative Renal ROS     Musculoskeletal   Abdominal   Peds  Hematology negative hematology ROS (+)   Anesthesia Other Findings Gall stones     Kidney stones   Stones    Pyelonephritis    Reproductive/Obstetrics (+) Pregnancy                           Anesthesia Physical Anesthesia Plan  ASA: III  Anesthesia Plan: Epidural   Post-op Pain Management:    Induction:   Airway Management Planned:   Additional Equipment:   Intra-op Plan:   Post-operative Plan:   Informed Consent: I have reviewed the patients History and Physical, chart, labs and discussed the procedure including the risks, benefits and alternatives for the proposed anesthesia with the patient or authorized representative who has indicated his/her understanding and acceptance.     Plan Discussed with:   Anesthesia Plan Comments:         Anesthesia Quick Evaluation

## 2011-10-21 MED ORDER — IBUPROFEN 600 MG PO TABS: 600.0000 mg | ORAL_TABLET | Freq: Four times a day (QID) | ORAL | Status: AC

## 2011-10-23 ENCOUNTER — Other Ambulatory Visit: Payer: Medicaid Other

## 2011-10-23 ENCOUNTER — Inpatient Hospital Stay (HOSPITAL_COMMUNITY): Admission: RE | Admit: 2011-10-23 | Payer: Medicaid Other | Source: Ambulatory Visit

## 2011-10-23 ENCOUNTER — Other Ambulatory Visit: Payer: Self-pay | Admitting: Advanced Practice Midwife

## 2011-10-23 DIAGNOSIS — O2340 Unspecified infection of urinary tract in pregnancy, unspecified trimester: Secondary | ICD-10-CM

## 2011-10-23 DIAGNOSIS — B951 Streptococcus, group B, as the cause of diseases classified elsewhere: Secondary | ICD-10-CM

## 2011-10-23 MED ORDER — AMOXICILLIN 500 MG PO CAPS: 500.0000 mg | ORAL_CAPSULE | Freq: Three times a day (TID) | ORAL | Status: AC

## 2011-10-23 NOTE — Progress Notes (Signed)
GBS UTI. No ABX in labor. Needs ABX. E-Rx'd Amox 500 TID x 7 days.

## 2011-10-23 NOTE — Progress Notes (Signed)
Post discharge chart review completed.  

## 2011-10-23 NOTE — Progress Notes (Signed)
Called [facility] home number and unable to leave a message, heard a message voicemail not set up. Called mobile phone and left message we are trying to reach you with some information from your provider, please call clinic.

## 2011-10-24 NOTE — Progress Notes (Signed)
Called mobile number and spoke w/boyfriend- he stated that Melisia is at the doctor's office. He said he would give Briyah a message when she got home. I informed him that she needs medication to treat a bladder infection - Rx has been sent to her pharmacy. He stated understanding and will give the message to [place].

## 2011-10-27 NOTE — Discharge Summary (Signed)
Obstetric Discharge Summary Reason for Admission: onset of labor Prenatal Procedures: none Intrapartum Procedures: spontaneous vaginal delivery Postpartum Procedures: none Complications-Operative and Postpartum: none Hemoglobin  Date Value Range Status  10/20/2011 12.1  12.0 - 15.0 g/dL Final     HCT  Date Value Range Status  10/20/2011 36.8  36.0 - 46.0 % Final    Physical Exam:  General: alert, cooperative and no distress Lochia: appropriate Uterine Fundus: firm Incision: N/A DVT Evaluation: No evidence of DVT seen on physical exam. Negative Homan's sign. No cords or calf tenderness. No significant calf/ankle edema.  Discharge Diagnoses: Term Pregnancy-delivered  Discharge Information: Date: 10/27/2011 Activity: pelvic rest Diet: routine Medications: Ibuprofen Condition: stable Instructions: refer to practice specific booklet Discharge to: home Follow-up Information    Follow up with [facility]. (Call to make postpartum appointment in 4-6 weeks.  )    Contact information:   [address] [place] [address] [phone]         Newborn Data: Live born female  Birth Weight: 9 lb 2.7 oz (4159 g) APGAR: 8, 9  Home with mother.  Mejia, Caitlin Lengacher 10/27/2011, 5:32 AM

## 2011-12-13 ENCOUNTER — Ambulatory Visit (INDEPENDENT_AMBULATORY_CARE_PROVIDER_SITE_OTHER): Payer: Medicaid Other | Admitting: Obstetrics & Gynecology

## 2011-12-13 ENCOUNTER — Encounter: Payer: Self-pay | Admitting: Obstetrics & Gynecology

## 2011-12-13 NOTE — Progress Notes (Signed)
Patient ID: Caitlin Mejia, female   DOB: Jul 15, 1985, 26 y.o.   MRN: [id number] Subjective:     Caitlin Mejia is a 26 y.o. female who presents for a postpartum visit. She is 6 week postpartum following a spontaneous vaginal delivery. I have fully reviewed the prenatal and intrapartum course. The delivery was at 40+ gestational weeks. Outcome: spontaneous vaginal delivery. Anesthesia: epidural. Postpartum course has been uncomplicated. Baby's course has been uncomplicated. Baby is feeding by bottle - n/a. Bleeding no bleeding. Bowel function is normal. Bladder function is normal. Patient is sexually active. Contraception method is condoms. Postpartum depression screening: negative.  The following portions of the patient's history were reviewed and updated as appropriate: allergies, current medications, past family history, past medical history, past social history, past surgical history and problem list.  Review of Systems Pertinent items are noted in HPI.   Objective:    BP 102/67  Pulse 108  Temp 98.9 F (37.2 C) (Oral)  Ht 5\' 2"  (1.575 m)  Wt 199 lb 8 oz (90.493 kg)  BMI 36.49 kg/m2  Breastfeeding? No  General:  alert, appears stated age and no distress   Breasts:  negative findings: not examined and no t examined  Lungs: clear to auscultation bilaterally  Heart:  regular rate and rhythm, S1, S2 normal, no murmur, click, rub or gallop  Abdomen: soft, non-tender; bowel sounds normal; no masses,  no organomegaly   Vulva:  normal  Vagina: normal vagina, no discharge, exudate, lesion, or erythema  Cervix:  no cervical motion tenderness  Corpus: normal size, contour, position, consistency, mobility, non-tender  Adnexa:  no mass, fullness, tenderness  Rectal Exam: Not performed.        Assessment:    6week postpartum exam. Pap smear not done at today's visit.   Plan:    1. Contraception: condoms 2. Reviewed with pt other options of birth control.  Pt wants to do more  'research' 3. Follow up in: 4 week or as needed.

## 2011-12-13 NOTE — Patient Instructions (Signed)
Contraception Choices Contraception (birth control) is the use of any methods or devices to prevent pregnancy. Below are some methods to help avoid pregnancy. HORMONAL METHODS   Contraceptive implant. This is a thin, plastic tube containing progesterone hormone. It does not contain estrogen hormone. Your caregiver inserts the tube in the inner part of the upper arm. The tube can remain in place for up to 3 years. After 3 years, the implant must be removed. The implant prevents the ovaries from releasing an egg (ovulation), thickens the cervical mucus which prevents sperm from entering the uterus, and thins the lining of the inside of the uterus.   Progesterone-only injections. These injections are given every 3 months by your caregiver to prevent pregnancy. This synthetic progesterone hormone stops the ovaries from releasing eggs. It also thickens cervical mucus and changes the uterine lining. This makes it harder for sperm to survive in the uterus.   Birth control pills. These pills contain estrogen and progesterone hormone. They work by stopping the egg from forming in the ovary (ovulation). Birth control pills are prescribed by a caregiver.Birth control pills can also be used to treat heavy periods.   Minipill. This type of birth control pill contains only the progesterone hormone. They are taken every day of each month and must be prescribed by your caregiver.   Birth control patch. The patch contains hormones similar to those in birth control pills. It must be changed once a week and is prescribed by a caregiver.   Vaginal ring. The ring contains hormones similar to those in birth control pills. It is left in the vagina for 3 weeks, removed for 1 week, and then a new one is put back in place. The patient must be comfortable inserting and removing the ring from the vagina.A caregiver's prescription is necessary.   Emergency contraception. Emergency contraceptives prevent pregnancy after  unprotected sexual intercourse. This pill can be taken right after sex or up to 5 days after unprotected sex. It is most effective the sooner you take the pills after having sexual intercourse. Emergency contraceptive pills are available without a prescription. Check with your pharmacist. Do not use emergency contraception as your only form of birth control.  BARRIER METHODS   Female condom. This is a thin sheath (latex or rubber) that is worn over the penis during sexual intercourse. It can be used with spermicide to increase effectiveness.   Female condom. This is a soft, loose-fitting sheath that is put into the vagina before sexual intercourse.   Diaphragm. This is a soft, latex, dome-shaped barrier that must be fitted by a caregiver. It is inserted into the vagina, along with a spermicidal jelly. It is inserted before intercourse. The diaphragm should be left in the vagina for 6 to 8 hours after intercourse.   Cervical cap. This is a round, soft, latex or plastic cup that fits over the cervix and must be fitted by a caregiver. The cap can be left in place for up to 48 hours after intercourse.   Sponge. This is a soft, circular piece of polyurethane foam. The sponge has spermicide in it. It is inserted into the vagina after wetting it and before sexual intercourse.   Spermicides. These are chemicals that kill or block sperm from entering the cervix and uterus. They come in the form of creams, jellies, suppositories, foam, or tablets. They do not require a prescription. They are inserted into the vagina with an applicator before having sexual intercourse. The process must be   repeated every time you have sexual intercourse.  INTRAUTERINE CONTRACEPTION  Intrauterine device (IUD). This is a T-shaped device that is put in a woman's uterus during a menstrual period to prevent pregnancy. There are 2 types:   Copper IUD. This type of IUD is wrapped in copper wire and is placed inside the uterus. Copper  makes the uterus and fallopian tubes produce a fluid that kills sperm. It can stay in place for 10 years.   Hormone IUD. This type of IUD contains the hormone progestin (synthetic progesterone). The hormone thickens the cervical mucus and prevents sperm from entering the uterus, and it also thins the uterine lining to prevent implantation of a fertilized egg. The hormone can weaken or kill the sperm that get into the uterus. It can stay in place for 5 years.  PERMANENT METHODS OF CONTRACEPTION  Female tubal ligation. This is when the woman's fallopian tubes are surgically sealed, tied, or blocked to prevent the egg from traveling to the uterus.   Female sterilization. This is when the female has the tubes that carry sperm tied off (vasectomy).This blocks sperm from entering the vagina during sexual intercourse. After the procedure, the man can still ejaculate fluid (semen).  NATURAL PLANNING METHODS  Natural family planning. This is not having sexual intercourse or using a barrier method (condom, diaphragm, cervical cap) on days the woman could become pregnant.   Calendar method. This is keeping track of the length of each menstrual cycle and identifying when you are fertile.   Ovulation method. This is avoiding sexual intercourse during ovulation.   Symptothermal method. This is avoiding sexual intercourse during ovulation, using a thermometer and ovulation symptoms.   Post-ovulation method. This is timing sexual intercourse after you have ovulated.  Regardless of which type or method of contraception you choose, it is important that you use condoms to protect against the transmission of sexually transmitted diseases (STDs). Talk with your caregiver about which form of contraception is most appropriate for you. Document Released: 03/06/2005 Document Revised: 02/23/2011 Document Reviewed: 07/13/2010 [employer] Patient Information 2012 [employer]. 

## 2012-04-22 ENCOUNTER — Emergency Department (HOSPITAL_COMMUNITY)
Admission: EM | Admit: 2012-04-22 | Discharge: 2012-04-23 | Disposition: A | Discharge status: Home / Self Care | Payer: Self-pay | Attending: Emergency Medicine | Admitting: Emergency Medicine

## 2012-04-22 ENCOUNTER — Encounter (HOSPITAL_COMMUNITY): Payer: Self-pay | Admitting: Emergency Medicine

## 2012-04-22 DIAGNOSIS — N390 Urinary tract infection, site not specified: Secondary | ICD-10-CM | POA: Insufficient documentation

## 2012-04-22 DIAGNOSIS — Z87442 Personal history of urinary calculi: Secondary | ICD-10-CM | POA: Insufficient documentation

## 2012-04-22 DIAGNOSIS — Z87448 Personal history of other diseases of urinary system: Secondary | ICD-10-CM | POA: Insufficient documentation

## 2012-04-22 DIAGNOSIS — Z8719 Personal history of other diseases of the digestive system: Secondary | ICD-10-CM | POA: Insufficient documentation

## 2012-04-22 DIAGNOSIS — Z3490 Encounter for supervision of normal pregnancy, unspecified, unspecified trimester: Secondary | ICD-10-CM

## 2012-04-22 LAB — CBC WITH DIFFERENTIAL/PLATELET
Basophils Absolute: 0 10*3/uL (ref 0.0–0.1)
Basophils Relative: 0 % (ref 0–1)
Eosinophils Absolute: 0.3 10*3/uL (ref 0.0–0.7)
Eosinophils Relative: 4 % (ref 0–5)
HCT: 38.6 % (ref 36.0–46.0)
Hemoglobin: 12.7 g/dL (ref 12.0–15.0)
Lymphocytes Relative: 34 % (ref 12–46)
Lymphs Abs: 2.8 10*3/uL (ref 0.7–4.0)
MCH: 27.7 pg (ref 26.0–34.0)
MCHC: 32.9 g/dL (ref 30.0–36.0)
MCV: 84.1 fL (ref 78.0–100.0)
Monocytes Absolute: 0.4 10*3/uL (ref 0.1–1.0)
Monocytes Relative: 5 % (ref 3–12)
Neutro Abs: 4.7 10*3/uL (ref 1.7–7.7)
Neutrophils Relative %: 57 % (ref 43–77)
Platelets: 225 10*3/uL (ref 150–400)
RBC: 4.59 MIL/uL (ref 3.87–5.11)
RDW: 12.9 % (ref 11.5–15.5)
WBC: 8.3 10*3/uL (ref 4.0–10.5)

## 2012-04-22 NOTE — ED Notes (Signed)
Patient reports that she has not had a period in two months and began bleeding four nights ago.  Patient reports tiny clots (smaller than dime sized).  Patient reports darker urine than normal.  Patient reports foul smelling discharge.  Patient reports that she is not on her normal period; flow increases from small to large, back to small, and then large amounts again.

## 2012-04-23 ENCOUNTER — Inpatient Hospital Stay (HOSPITAL_COMMUNITY): Payer: Self-pay

## 2012-04-23 ENCOUNTER — Inpatient Hospital Stay (HOSPITAL_COMMUNITY)
Admission: AD | Admit: 2012-04-23 | Discharge: 2012-04-23 | Disposition: A | Discharge status: Home / Self Care | Payer: Self-pay | Source: Ambulatory Visit | Attending: Family Medicine | Admitting: Family Medicine

## 2012-04-23 ENCOUNTER — Encounter (HOSPITAL_COMMUNITY): Payer: Self-pay

## 2012-04-23 DIAGNOSIS — O3680X0 Pregnancy with inconclusive fetal viability, not applicable or unspecified: Secondary | ICD-10-CM

## 2012-04-23 DIAGNOSIS — Z8632 Personal history of gestational diabetes: Secondary | ICD-10-CM | POA: Insufficient documentation

## 2012-04-23 DIAGNOSIS — O209 Hemorrhage in early pregnancy, unspecified: Secondary | ICD-10-CM

## 2012-04-23 HISTORY — DX: Chronic kidney disease, unspecified: N18.9

## 2012-04-23 LAB — GC/CHLAMYDIA PROBE AMP
CT Probe RNA: NEGATIVE
GC Probe RNA: NEGATIVE

## 2012-04-23 LAB — CBC WITH DIFFERENTIAL/PLATELET
Basophils Absolute: 0 10*3/uL (ref 0.0–0.1)
Basophils Relative: 0 % (ref 0–1)
Eosinophils Absolute: 0.2 10*3/uL (ref 0.0–0.7)
Eosinophils Relative: 3 % (ref 0–5)
HCT: 37.9 % (ref 36.0–46.0)
Hemoglobin: 12.4 g/dL (ref 12.0–15.0)
Lymphocytes Relative: 32 % (ref 12–46)
Lymphs Abs: 2.3 10*3/uL (ref 0.7–4.0)
MCH: 27.5 pg (ref 26.0–34.0)
MCHC: 32.7 g/dL (ref 30.0–36.0)
MCV: 84 fL (ref 78.0–100.0)
Monocytes Absolute: 0.4 10*3/uL (ref 0.1–1.0)
Monocytes Relative: 6 % (ref 3–12)
Neutro Abs: 4.1 10*3/uL (ref 1.7–7.7)
Neutrophils Relative %: 58 % (ref 43–77)
Platelets: 208 10*3/uL (ref 150–400)
RBC: 4.51 MIL/uL (ref 3.87–5.11)
RDW: 12.9 % (ref 11.5–15.5)
WBC: 7.1 10*3/uL (ref 4.0–10.5)

## 2012-04-23 LAB — URINE MICROSCOPIC-ADD ON

## 2012-04-23 LAB — URINALYSIS, ROUTINE W REFLEX MICROSCOPIC
Bilirubin Urine: NEGATIVE
Glucose, UA: NEGATIVE mg/dL
Ketones, ur: NEGATIVE mg/dL
Nitrite: NEGATIVE
Protein, ur: NEGATIVE mg/dL
Specific Gravity, Urine: 1.02 (ref 1.005–1.030)
Urobilinogen, UA: 0.2 mg/dL (ref 0.0–1.0)
pH: 6.5 (ref 5.0–8.0)

## 2012-04-23 LAB — WET PREP, GENITAL
Trich, Wet Prep: NONE SEEN
Yeast Wet Prep HPF POC: NONE SEEN

## 2012-04-23 LAB — HCG, QUANTITATIVE, PREGNANCY: hCG, Beta Chain, Quant, S: 11599 m[IU]/mL — ABNORMAL HIGH (ref <5)

## 2012-04-23 LAB — COMPREHENSIVE METABOLIC PANEL
ALT: 14 U/L (ref 0–35)
AST: 17 U/L (ref 0–37)
Albumin: 3.7 g/dL (ref 3.5–5.2)
Alkaline Phosphatase: 52 U/L (ref 39–117)
BUN: 10 mg/dL (ref 6–23)
CO2: 25 mEq/L (ref 19–32)
Calcium: 9.2 mg/dL (ref 8.4–10.5)
Chloride: 104 mEq/L (ref 96–112)
Creatinine, Ser: 0.82 mg/dL (ref 0.50–1.10)
GFR calc Af Amer: >90 mL/min (ref >90)
GFR calc non Af Amer: >90 mL/min (ref >90)
Glucose, Bld: 105 mg/dL — ABNORMAL HIGH (ref 70–99)
Potassium: 4 mEq/L (ref 3.5–5.1)
Sodium: 138 mEq/L (ref 135–145)
Total Bilirubin: 0.2 mg/dL — ABNORMAL LOW (ref 0.3–1.2)
Total Protein: 7.5 g/dL (ref 6.0–8.3)

## 2012-04-23 LAB — POCT PREGNANCY, URINE: Preg Test, Ur: POSITIVE — AB

## 2012-04-23 MED ORDER — CEPHALEXIN 500 MG PO CAPS: 1000.0000 mg | ORAL_CAPSULE | Freq: Two times a day (BID) | ORAL | Status: DC

## 2012-04-23 NOTE — MAU Note (Signed)
Pt states was seen at [facility], has had increased vaginal bleeding, was told she was pregnant, however RN unable to find any info regarding +upt, or bloodwork, or u/s. Pt is concerned if she is pregnant that she is miscarrying.

## 2012-04-23 NOTE — ED Provider Notes (Signed)
Medical screening examination/treatment/procedure(s) were performed by non-physician practitioner and as supervising physician I was immediately available for consultation/collaboration.  Jasmine Awe, MD 04/23/12 [phone]

## 2012-04-23 NOTE — MAU Provider Note (Signed)
History     CSN: [id number]  Arrival date and time: 04/23/12 5621   First Provider Initiated Contact with Patient 04/23/12 1934      Chief Complaint  Patient presents with  . Vaginal Bleeding   HPI Caitlin Mejia is a 27 y.o. female who presents to MAU with vaginal bleeding.  LMP 03/03/12. The bleeding started 5 days ago. She describes the bleeding as starting lighter than a period and then got heavier last night and today. Associated symptoms include nausea but no vomiting. Went to [facility] last night and treated for UTI and told she was pregnant.  Last pap smear less than one year ago here in the GYN clinic and was normal. No birth control. Last vaginal delivery 10/2011. No history of STI's. Current sex partner x 2 years. The history was provided by the patient.  OB History    Grav Para Term Preterm Abortions TAB SAB Ect Mult Living   5 5 5  0 0 0 0 0 0 5      Past Medical History  Diagnosis Date  . Gall stones   . Chronic kidney disease     Past Surgical History  Procedure Date  . Ureteral stent placement   . Wisdom teeth     Family History  Problem Relation Age of Onset  . Cancer Father     History  Substance Use Topics  . Smoking status: Never Smoker   . Smokeless tobacco: Never Used  . Alcohol Use: Yes     Comment: Rarely    Allergies: No Known Allergies  Prescriptions prior to admission  Medication Sig Dispense Refill  . cephALEXin (KEFLEX) 500 MG capsule Take 2 capsules (1,000 mg total) by mouth 2 (two) times daily.  28 capsule  0    Review of Systems  Constitutional: Negative for fever, chills and weight loss.  HENT: Negative for ear pain, nosebleeds, congestion, sore throat and neck pain.   Eyes: Negative for blurred vision, double vision, photophobia and pain.  Respiratory: Negative for cough, shortness of breath and wheezing.   Cardiovascular: Negative for chest pain, palpitations and leg swelling.  Gastrointestinal: Positive for nausea. Negative  for heartburn, vomiting, abdominal pain, diarrhea and constipation.  Genitourinary: Positive for frequency. Negative for dysuria and urgency.       Vaginal bleeding  Musculoskeletal: Positive for back pain (chronic). Negative for myalgias.  Skin: Negative for itching and rash.  Neurological: Positive for headaches. Negative for dizziness, sensory change, speech change, seizures and weakness.  Endo/Heme/Allergies: Does not bruise/bleed easily.  Psychiatric/Behavioral: Negative for depression and substance abuse. The patient is not nervous/anxious.    Blood pressure 113/65, pulse 67, temperature 98 F (36.7 C), temperature source Oral, resp. rate 16, height 5\' 2"  (1.575 m), weight 212 lb (96.163 kg), not currently breastfeeding.  Physical Exam  Nursing note and vitals reviewed. Constitutional: She is oriented to person, place, and time. She appears well-developed and well-nourished. No distress.  HENT:  Head: Normocephalic and atraumatic.  Eyes: EOM are normal.  Neck: Neck supple.  Cardiovascular: Normal rate.   Respiratory: Effort normal.  GI: Soft. There is no tenderness.  Genitourinary:       Exam done last night at [facility] with cultures and wet prep.   Musculoskeletal: Normal range of motion.  Neurological: She is alert and oriented to person, place, and time.  Skin: Skin is warm and dry.  Psychiatric: She has a normal mood and affect. Her behavior is normal. Judgment and thought  content normal.   Assessment: 27 y.o. female @ [redacted] weeks gestation by LMP with vaginal bleeding  Plan:  CBC, BHCG, ultrasound  Care turned over to [place], CNM @ 20:00  Procedures  NEESE,HOPE, RN, FNP, [facility] 04/23/2012, 7:34 PM   Assumed care of pt at 2000. Pt in [place].     Results for orders placed during the hospital encounter of 04/23/12 (from the past 24 hour(s))  POCT PREGNANCY, URINE     Status: Abnormal   Collection Time   04/23/12  7:02 PM      Component Value Range   Preg Test, Ur POSITIVE  (*) NEGATIVE  CBC WITH DIFFERENTIAL     Status: Normal   Collection Time   04/23/12  7:58 PM      Component Value Range   WBC 7.1  4.0 - 10.5 K/uL   RBC 4.51  3.87 - 5.11 MIL/uL   Hemoglobin 12.4  12.0 - 15.0 g/dL   HCT 16.1  09.6 - 04.5 %   MCV 84.0  78.0 - 100.0 fL   MCH 27.5  26.0 - 34.0 pg   MCHC 32.7  30.0 - 36.0 g/dL   RDW 40.9  81.1 - 91.4 %   Platelets 208  150 - 400 K/uL   Neutrophils Relative 58  43 - 77 %   Neutro Abs 4.1  1.7 - 7.7 K/uL   Lymphocytes Relative 32  12 - 46 %   Lymphs Abs 2.3  0.7 - 4.0 K/uL   Monocytes Relative 6  3 - 12 %   Monocytes Absolute 0.4  0.1 - 1.0 K/uL   Eosinophils Relative 3  0 - 5 %   Eosinophils Absolute 0.2  0.0 - 0.7 K/uL   Basophils Relative 0  0 - 1 %   Basophils Absolute 0.0  0.0 - 0.1 K/uL  HCG, QUANTITATIVE, PREGNANCY     Status: Abnormal   Collection Time   04/23/12  7:58 PM      Component Value Range   hCG, Beta Chain, Quant, S 11599 (*) <5 mIU/mL   Assessment: 1. Bleeding in early pregnancy   2. Pregnancy with uncertain fetal viability   IUP confirmed   Plan: D/C home Bleeding precautions. Pelvic rest x 1 week. Follow-up Information    Follow up with THE [facility] OF [place] ULTRASOUND. In 10 days. (ultrasound will call you to schedule  follow-up )    Contact information:   [address] [id number] mc [place] [address] [phone]      Follow up with THE [facility] OF [place] MATERNITY ADMISSIONS. (As needed if symptoms worsen)    Contact information:   [address] [id number] mc [place] [address] [phone]          Medication List     As of 04/23/2012  9:32 PM    CONTINUE taking these medications         cephALEXin 500 MG capsule   Commonly known as: KEFLEX   Take 2 capsules (1,000 mg total) by mouth 2 (two) times daily.         [place], [place] 04/23/2012 9:32 PM

## 2012-04-23 NOTE — ED Provider Notes (Signed)
History     CSN: [id number]  Arrival date & time 04/22/12  2103   First MD Initiated Contact with Patient 04/22/12 2335      Chief Complaint  Patient presents with  . Vaginal Bleeding    (Consider location/radiation/quality/duration/timing/severity/associated sxs/prior treatment) HPI History provided by pt.   Pt is 25 months post-partem.  Had a period the first 3 months following vaginal delivery, but has not had one since then.  Approx 4 days ago, she developed very light spotting and it was a little heavier today.  She has not needed to wear a pad or tampon.  No associated fever, abdominal pain, low back pain, urinary or other vaginal sx.  She is not currently on birth control.   Past Medical History  Diagnosis Date  . Gall stones   . Kidney stones     Stones  . Pyelonephritis     Past Surgical History  Procedure Date  . Ureteral stent placement   . Wisdom teeth     Family History  Problem Relation Age of Onset  . Cancer Father     History  Substance Use Topics  . Smoking status: Never Smoker   . Smokeless tobacco: Never Used  . Alcohol Use: Yes     Comment: Rarely    OB History    Grav Para Term Preterm Abortions TAB SAB Ect Mult Living   5 5 5  0 0 0 0 0 0 5      Review of Systems  All other systems reviewed and are negative.    Allergies  Review of patient's allergies indicates no known allergies.  Home Medications  No current outpatient prescriptions on file.  BP 124/54  Pulse 63  Temp 98.3 F (36.8 C) (Oral)  Resp 18  SpO2 100%  Physical Exam  Nursing note and vitals reviewed. Constitutional: She is oriented to person, place, and time. She appears well-developed and well-nourished. No distress.  HENT:  Head: Normocephalic and atraumatic.  Eyes:       Normal appearance  Neck: Normal range of motion.  Cardiovascular: Normal rate and regular rhythm.   Pulmonary/Chest: Effort normal and breath sounds normal. No respiratory distress.   Abdominal: Soft. Bowel sounds are normal. She exhibits no distension and no mass. There is no tenderness. There is no rebound and no guarding.  Genitourinary:       No CVA tenderness.  Nml external genitalia. Scant vaginal bleeding; no discharge.  Cervix closed and appears nml.  No adnexal or cervical motion tenderess.    Musculoskeletal: Normal range of motion.  Neurological: She is alert and oriented to person, place, and time.  Skin: Skin is warm and dry. No rash noted.  Psychiatric: She has a normal mood and affect. Her behavior is normal.    ED Course  Procedures (including critical care time)  Labs Reviewed  COMPREHENSIVE METABOLIC PANEL - Abnormal; Notable for the following:    Glucose, Bld 105 (*)     Total Bilirubin 0.2 (*)     All other components within normal limits  URINALYSIS, ROUTINE W REFLEX MICROSCOPIC - Abnormal; Notable for the following:    APPearance CLOUDY (*)     Hgb urine dipstick MODERATE (*)     Leukocytes, UA LARGE (*)     All other components within normal limits  WET PREP, GENITAL - Abnormal; Notable for the following:    Clue Cells Wet Prep HPF POC FEW (*)     WBC, Wet Prep  HPF POC MANY (*)     All other components within normal limits  URINE MICROSCOPIC-ADD ON - Abnormal; Notable for the following:    Bacteria, UA FEW (*)     All other components within normal limits  CBC WITH DIFFERENTIAL  GC/CHLAMYDIA PROBE AMP  URINE CULTURE   No results found.   1. Pregnancy   2. Urinary tract infection       MDM  27yo healthy F, 6 months PP, presents w/ vaginal spotting x 4 days.  Has not had a period for the past 2-3 months.  No associated sx.  Exam unremarkable.  Pregnancy test positive and U/A positive for infection.  Blood type B+ based on prior chart and pt never received a rhogam shot in prior 5 pregnancies.  All results discussed w/ pt.  D/c'd home w/ keflex.  Advised close f/u w/ her OB.  She is aware that [facility] has an ED.     Otilio Miu, PA-C 04/23/12 [phone]

## 2012-04-24 LAB — URINE CULTURE: Colony Count: >=100000

## 2012-04-24 NOTE — MAU Provider Note (Signed)
Chart reviewed and agree with management and plan.  

## 2012-04-25 NOTE — ED Notes (Signed)
+   Urine Patient treated with Keflex-sensitive to same-chart appended per protocol MD. 

## 2012-04-27 ENCOUNTER — Inpatient Hospital Stay (HOSPITAL_COMMUNITY)
Admission: AD | Admit: 2012-04-27 | Discharge: 2012-04-28 | Disposition: A | Discharge status: Home / Self Care | Payer: Self-pay | Source: Ambulatory Visit | Attending: Obstetrics & Gynecology | Admitting: Obstetrics & Gynecology

## 2012-04-27 DIAGNOSIS — O209 Hemorrhage in early pregnancy, unspecified: Secondary | ICD-10-CM

## 2012-04-27 DIAGNOSIS — O200 Threatened abortion: Secondary | ICD-10-CM | POA: Insufficient documentation

## 2012-04-27 NOTE — MAU Note (Signed)
I came one day last wk for bleeding. The bleeding is heavier. Earlier today I passed a golfball size clot.

## 2012-04-28 ENCOUNTER — Encounter (HOSPITAL_COMMUNITY): Payer: Self-pay

## 2012-04-28 ENCOUNTER — Inpatient Hospital Stay (HOSPITAL_COMMUNITY): Payer: Self-pay

## 2012-04-28 DIAGNOSIS — O200 Threatened abortion: Secondary | ICD-10-CM

## 2012-04-28 LAB — HCG, QUANTITATIVE, PREGNANCY: hCG, Beta Chain, Quant, S: 7966 m[IU]/mL — ABNORMAL HIGH (ref <5)

## 2012-04-28 MED ORDER — KETOROLAC TROMETHAMINE 60 MG/2ML IM SOLN
60.0000 mg | Freq: Once | INTRAMUSCULAR | Status: AC
  Administered 2012-04-28: 60 mg via INTRAMUSCULAR
  Filled 2012-04-28: qty 2

## 2012-04-28 NOTE — MAU Provider Note (Signed)
  History     CSN: [id number]  Arrival date and time: 04/27/12 2329   None     Chief Complaint  Patient presents with  . Threatened Miscarriage   HPI  Pt is a G6P5005 here at 6.5 wks IUP here for increased vaginal bleeding.  Seen in MAU on 04/24/11 for vaginal bleeding, ultrasound completed no fetal heart rate seen.  Pt reports experiencing a large golfball sized clot at 2300 last night.  Bleeding is reported as moderate with pelvic cramping.    Past Medical History  Diagnosis Date  . Gall stones   . Chronic kidney disease     Past Surgical History  Procedure Laterality Date  . Ureteral stent placement    . Wisdom teeth      Family History  Problem Relation Age of Onset  . Cancer Father     History  Substance Use Topics  . Smoking status: Never Smoker   . Smokeless tobacco: Never Used  . Alcohol Use: Yes     Comment: Rarely    Allergies: No Known Allergies  Prescriptions prior to admission  Medication Sig Dispense Refill  . cephALEXin (KEFLEX) 500 MG capsule Take 2 capsules (1,000 mg total) by mouth 2 (two) times daily.  28 capsule  0    Review of Systems  Constitutional: Positive for malaise/fatigue.  Gastrointestinal: Positive for abdominal pain (cramping).  Genitourinary:       Vaginal bleeding  Neurological: Negative for dizziness.  All other systems reviewed and are negative.   Physical Exam   Blood pressure 110/53, pulse 74, temperature 98 F (36.7 C), resp. rate 20, height 5\' 2"  (1.575 m), weight 95.8 kg (211 lb 3.2 oz), not currently breastfeeding.  Physical Exam  Constitutional: She is oriented to person, place, and time. She appears well-developed and well-nourished.  HENT:  Head: Normocephalic.  Neck: Normal range of motion. Neck supple.  Cardiovascular: Normal rate, regular rhythm and normal heart sounds.   Respiratory: Effort normal and breath sounds normal. No respiratory distress.  GI: Soft. She exhibits no mass. There is no tenderness.  There is no rebound and no guarding.  Genitourinary: There is bleeding ( scant) around the vagina.  Neurological: She is alert and oriented to person, place, and time.  Skin: Skin is warm and dry.    MAU Course  Procedures Toradol 60 mg IM  No results found for this or any previous visit (from the past 24 hour(s)). Ultrasound: IMPRESSION:  Intrauterine gestational sac with estimated gestational age [redacted] weeks  3 days by mean sac diameter. No yolk sac or definite fetal pole.  Lack of appropriate growth from the recent prior study is  concerning. Correlate with beta HCG.  Follow-up pelvic ultrasound is suggested in 10-14 days (or earlier  as clinically warranted).  Assessment and Plan  Threatened Miscarriage  Plan: DC to home Bleeding precautions Repeat ultrasound in 10 days  [facility] 04/28/2012, 12:24 AM

## 2012-04-28 NOTE — MAU Note (Signed)
Patient has been bleeding for 8 days heavier today, passed a clot.

## 2012-04-28 NOTE — MAU Provider Note (Signed)
Attestation of Attending Supervision of Advanced Practitioner (CNM/NP): Evaluation and management procedures were performed by the Advanced Practitioner under my supervision and collaboration.  I have reviewed the Advanced Practitioner's note and chart, and I agree with the management and plan.  HARRAWAY-SMITH, Franci Oshana 9:14 AM     

## 2012-04-30 ENCOUNTER — Inpatient Hospital Stay (HOSPITAL_COMMUNITY)
Admission: AD | Admit: 2012-04-30 | Discharge: 2012-05-01 | Disposition: A | Discharge status: Home / Self Care | Payer: Self-pay | Source: Ambulatory Visit | Attending: Obstetrics & Gynecology | Admitting: Obstetrics & Gynecology

## 2012-04-30 ENCOUNTER — Inpatient Hospital Stay (HOSPITAL_COMMUNITY): Payer: Self-pay

## 2012-04-30 ENCOUNTER — Encounter (HOSPITAL_COMMUNITY): Payer: Self-pay | Admitting: *Deleted

## 2012-04-30 DIAGNOSIS — O039 Complete or unspecified spontaneous abortion without complication: Secondary | ICD-10-CM

## 2012-04-30 LAB — CBC
HCT: 36.3 % (ref 36.0–46.0)
Hemoglobin: 11.7 g/dL — ABNORMAL LOW (ref 12.0–15.0)
MCH: 27.5 pg (ref 26.0–34.0)
MCHC: 32.2 g/dL (ref 30.0–36.0)
MCV: 85.4 fL (ref 78.0–100.0)
Platelets: 198 10*3/uL (ref 150–400)
RBC: 4.25 MIL/uL (ref 3.87–5.11)
RDW: 13.1 % (ref 11.5–15.5)
WBC: 6.4 10*3/uL (ref 4.0–10.5)

## 2012-04-30 NOTE — MAU Note (Signed)
Pt states she has been bleeding since 04/19/2012. Pt states she had large amount of bleeding and passed 2 large clots back to back, around 1730.

## 2012-04-30 NOTE — MAU Provider Note (Signed)
Chief Complaint: Vaginal Bleeding   First Provider Initiated Contact with Patient 04/30/12 2155     SUBJECTIVE HPI: Caitlin Mejia is a 27 y.o. G6P5005 at [redacted]w[redacted]d by LMP who presents with increased vaginal bleeding, cramping, passing clots and possible passage of gestational sac this evening. Cramping and bleeding have decreased significantly since passage of tissue. Pt  has been seen in ED and MAU x 3 for bleeding in early pregnancy. FP seen 04/23/12 w/out cardiac activity. No FP seen 04/28/12, quant dropped.    Past Medical History  Diagnosis Date  . Gall stones   . Chronic kidney disease    OB History   Grav Para Term Preterm Abortions TAB SAB Ect Mult Living   6 5 5  0 0 0 0 0 0 5     # Outc Date GA Lbr Len/2nd Wgt Sex Del Anes PTL Lv   1 TRM 8/13 [redacted]w[redacted]d 05:46 / 00:08 1.610RU(0AV4.0JW) F SVD Other  Yes   2 CUR            3 TRM      SVD      4 TRM      SVD      5 TRM      SVD      6 TRM      SVD        Past Surgical History  Procedure Laterality Date  . Ureteral stent placement    . Wisdom teeth     History   Social History  . Marital Status: Single    Spouse Name: N/A    Number of Children: N/A  . Years of Education: N/A   Occupational History  . Not on file.   Social History Main Topics  . Smoking status: Never Smoker   . Smokeless tobacco: Never Used  . Alcohol Use: Yes     Comment: Rarely  . Drug Use: No  . Sexually Active: Yes    Birth Control/ Protection: None   Other Topics Concern  . Not on file   Social History Narrative  . No narrative on file   No current facility-administered medications on file prior to encounter.   No current outpatient prescriptions on file prior to encounter.   No Known Allergies  ROS: Denies dizziness, fever, chills.   OBJECTIVE Blood pressure 102/68, pulse 70, temperature 98.1 F (36.7 C), temperature source Oral, resp. rate 18, height 5\' 2"  (1.575 m), weight 95.709 kg (211 lb), SpO2 100.00%. GENERAL: Well-developed,  well-nourished female in no acute distress.  HEENT: Normocephalic HEART: normal rate RESP: normal effort ABDOMEN: Soft, non-tender EXTREMITIES: Nontender, no edema NEURO: Alert and oriented SPECULUM EXAM: NEFG, small-moderate blood and 1 small blood clot noted, cervix clean BIMANUAL: cervix FT; uterus normal size, no adnexal tenderness or masses  LAB RESULTS Results for orders placed during the hospital encounter of 04/30/12 (from the past 24 hour(s))  CBC     Status: Abnormal   Collection Time    04/30/12 10:02 PM      Result Value Range   WBC 6.4  4.0 - 10.5 K/uL   RBC 4.25  3.87 - 5.11 MIL/uL   Hemoglobin 11.7 (*) 12.0 - 15.0 g/dL   HCT 11.9  14.7 - 82.9 %   MCV 85.4  78.0 - 100.0 fL   MCH 27.5  26.0 - 34.0 pg   MCHC 32.2  30.0 - 36.0 g/dL   RDW 56.2  13.0 - 86.5 %   Platelets 198  150 - 400 K/uL    IMAGING US Ob Transvaginal  04/30/2012  *RADIOLOGY REPORT*  Clinical Data: Increased vaginal bleeding.  TRANSVAGINAL OBSTETRIC US  Technique:  Transvaginal ultrasound was performed for complete evaluation of the gestation as well as the maternal uterus, adnexal regions, and pelvic cul-de-sac.  Comparison:  None.  Intrauterine gestational sac: None seen. Yolk sac: N/A Embryo: N/A  Maternal uterus/adnexae: The endometrial echo complex is heterogeneous in appearance, measuring 1.4 cm, with a small amount of complex fluid noted in the endometrial canal, compatible with blood.  No gestational sac is now seen.  The uterus is otherwise unremarkable.  The ovaries are within normal limits.  The right ovary measures 2.8 x 1.4 x 2.2 cm, while the left ovary measures 3.7 x 1.9 x 2.4 cm.  No suspicious adnexal masses are seen; there is no evidence for ovarian torsion.  No free fluid is seen in the pelvic cul-de-sac.  IMPRESSION: No intrauterine gestational sac seen, compatible with spontaneous abortion.  Heterogeneous appearance to the endometrial echo complex, with a small amount of complex fluid in  the endometrial canal, compatible with blood.   Original Report Authenticated By: Tonia Ghent, M.D.    MAU COURSE Bleeding, Hgb stable. Minimal pain w/out pain meds.   ASSESSMENT 1. Miscarriage    PLAN Discharge home. Bleeding precautions No IC until bleeding has stopped and contraception started. Pt not TTC. Support given.      Follow-up Information   Follow up with [facility] In 2 weeks.   Contact information:   [address] [place] [address] [phone]      Follow up with THE [facility] OF [place] MATERNITY ADMISSIONS. (As needed if symptoms worsen)    Contact information:   [address] [place] [address] [phone]       Medication List    STOP taking these medications       cephALEXin 500 MG capsule  Commonly known as:  KEFLEX      TAKE these medications       ibuprofen 200 MG tablet  Commonly known as:  ADVIL,MOTRIN  Take 3 tablets (600 mg total) by mouth every 6 (six) hours as needed for pain.       [place], [place] 04/30/2012  9:26 PM

## 2012-04-30 NOTE — MAU Note (Signed)
Pt states she has been seen here x 3 for bleeding with pregnancy, bleeding is heavier and passed a very large clot today. abd and back pain. Thinks later this afternoon she passed the baby.

## 2012-05-01 DIAGNOSIS — O039 Complete or unspecified spontaneous abortion without complication: Secondary | ICD-10-CM

## 2012-05-01 MED ORDER — IBUPROFEN 200 MG PO TABS: 600.0000 mg | ORAL_TABLET | Freq: Four times a day (QID) | ORAL | Status: DC | PRN

## 2012-05-01 NOTE — MAU Note (Signed)
Pt given comfort pack with comfort pillow, and resource list as well and heartstring information. Pt tearful on seeing the pillow but expresses understanding of process

## 2012-05-01 NOTE — MAU Provider Note (Signed)
Attestation of Attending Supervision of Advanced Practitioner (PA/CNM/NP): Evaluation and management procedures were performed by the Advanced Practitioner under my supervision and collaboration.  I have reviewed the Advanced Practitioner's note and chart, and I agree with the management and plan.  Deanthony Maull, MD, FACOG Attending Obstetrician & Gynecologist Faculty Practice, [facility]  

## 2012-05-03 ENCOUNTER — Ambulatory Visit (HOSPITAL_COMMUNITY): Admission: RE | Admit: 2012-05-03 | Payer: Self-pay | Source: Ambulatory Visit

## 2013-02-26 ENCOUNTER — Encounter (HOSPITAL_COMMUNITY): Payer: Self-pay | Admitting: *Deleted

## 2013-03-20 IMAGING — CT CT ABD-PELV W/O CM
2 of 4 series · 17 of 46 positions shown, 19 images · non-contrast
Comparison: 09/05/2009

CLINICAL DATA: Right flank pain.

CT ABDOMEN AND PELVIS WITHOUT CONTRAST
TECHNIQUE: Multidetector CT imaging of the abdomen and pelvis was
performed following the standard protocol without intravenous
contrast.

[Series 2: stone <(id) >(id) · axial · 0.75mm/px · z∈[-482,-52]mm · 14 of 96 slices shown, 16 images]
[im 5/96  soft-tissue]
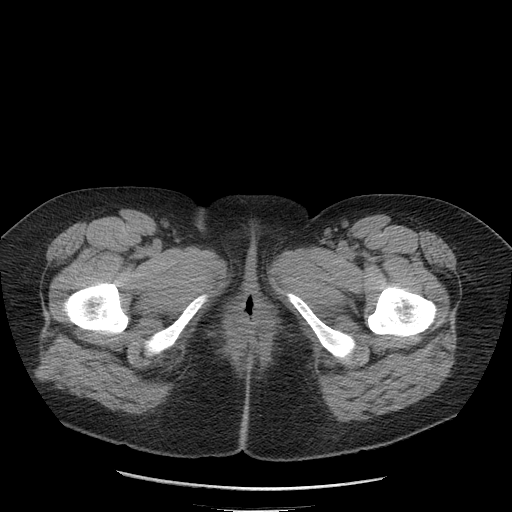
[im 5/96  bone]
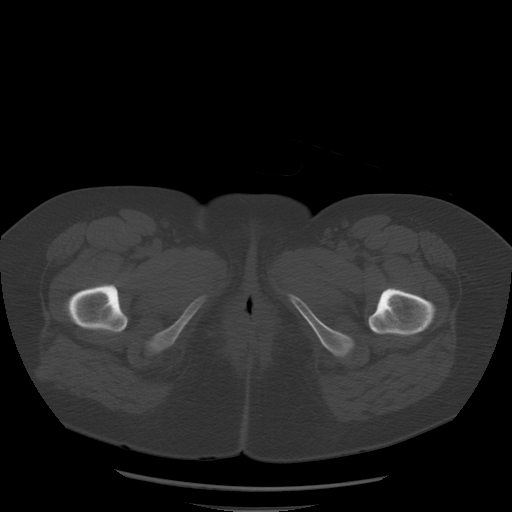
[im 13/96  soft-tissue]
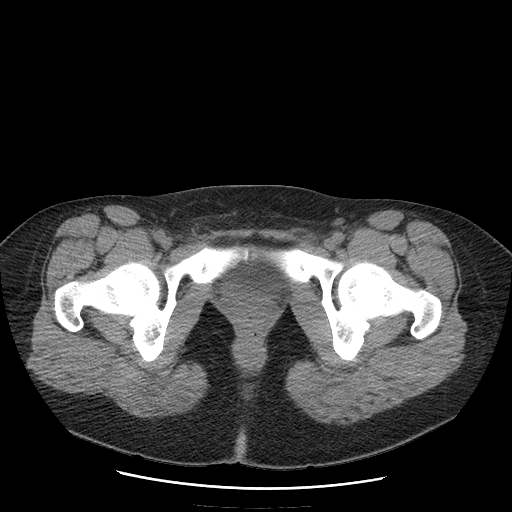
[im 17/96  soft-tissue]
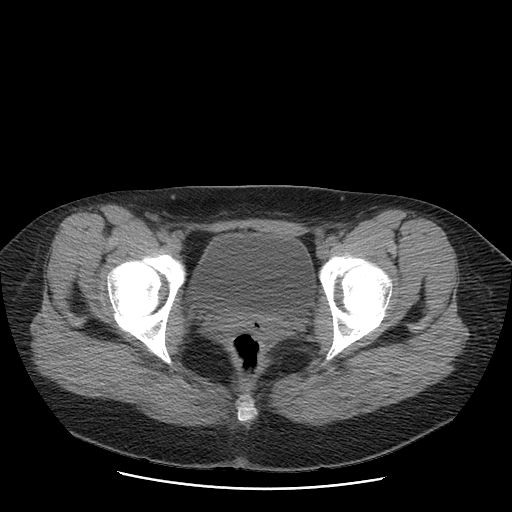
[im 25/96  soft-tissue]
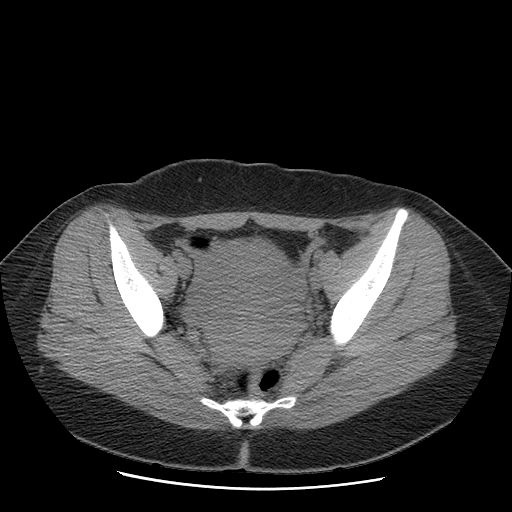
[im 34/96  soft-tissue]
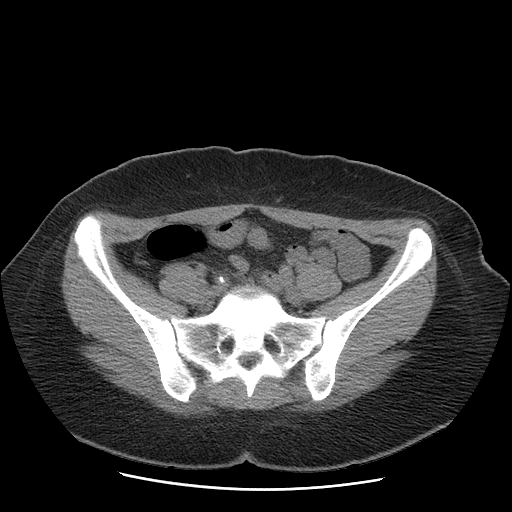
[im 38/96  soft-tissue]
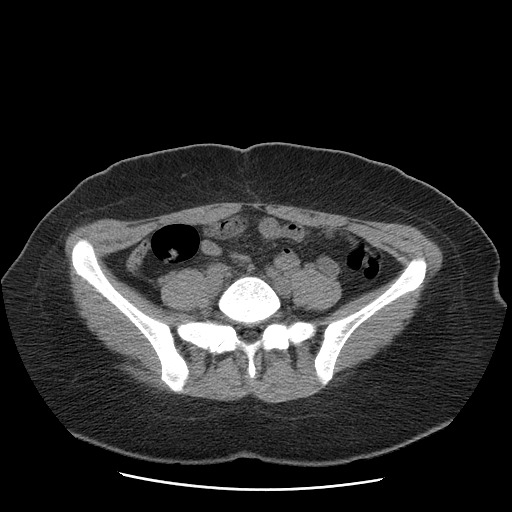
[im 46/96  soft-tissue]
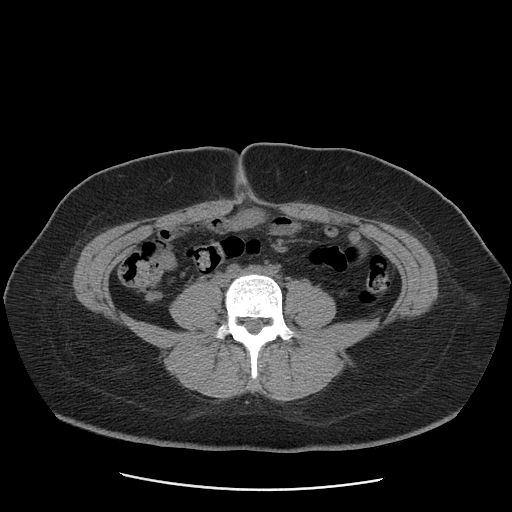
[im 50/96  soft-tissue]
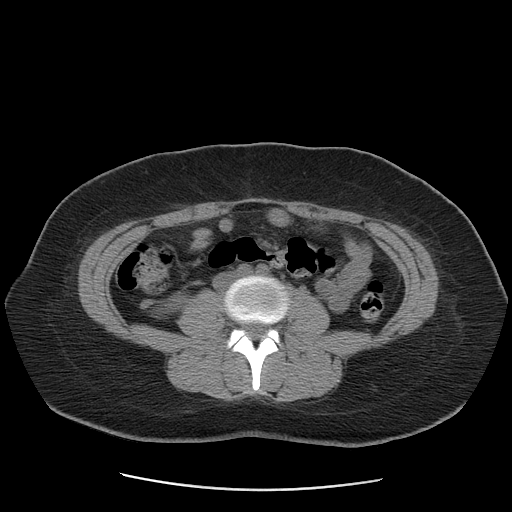
[im 58/96  soft-tissue]
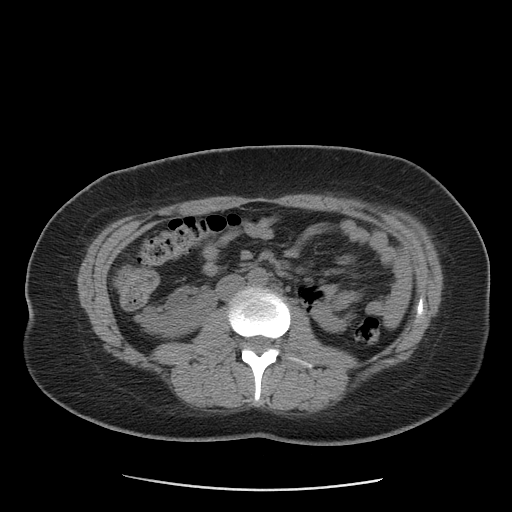
[im 58/96  bone]
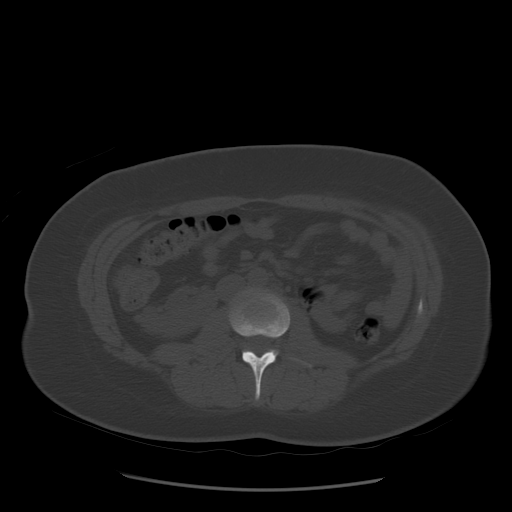
[im 62/96  soft-tissue]
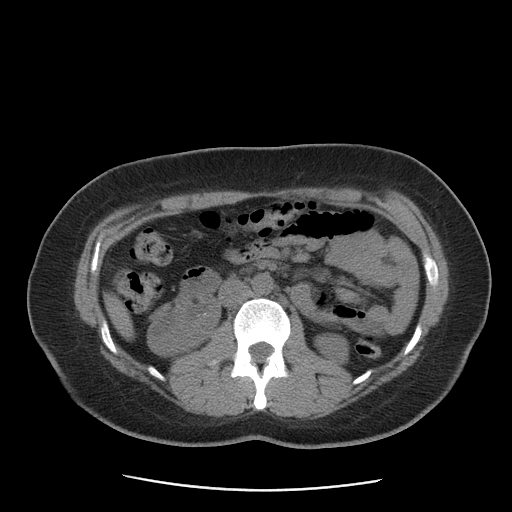
[im 71/96  soft-tissue]
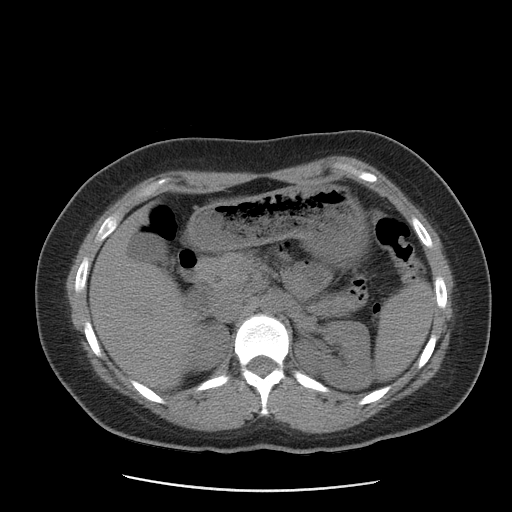
[im 79/96  soft-tissue]
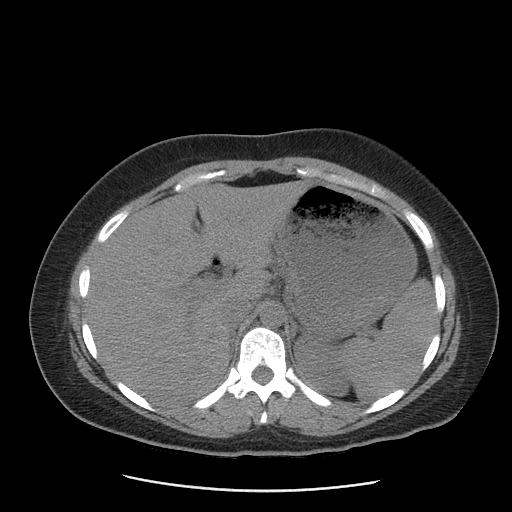
[im 83/96  soft-tissue]
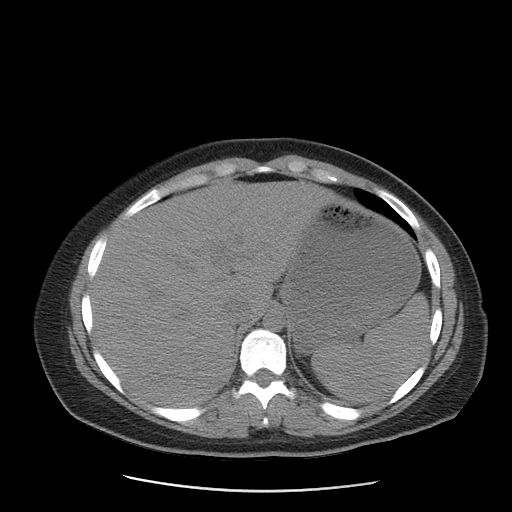
[im 91/96  soft-tissue]
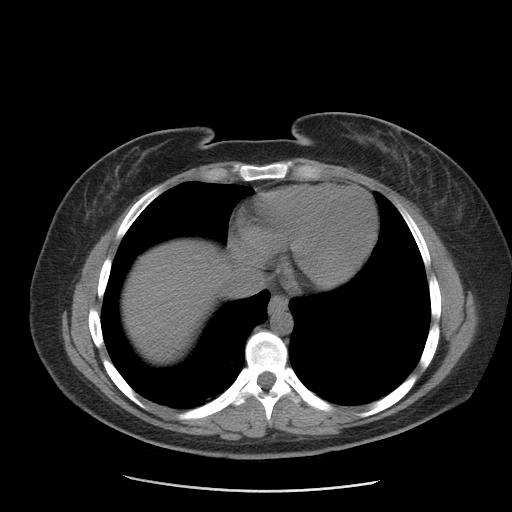

[Series 401: cor · coronal · 0.96mm/px · 3 of 96 slices shown]
[im 32/96  soft-tissue]
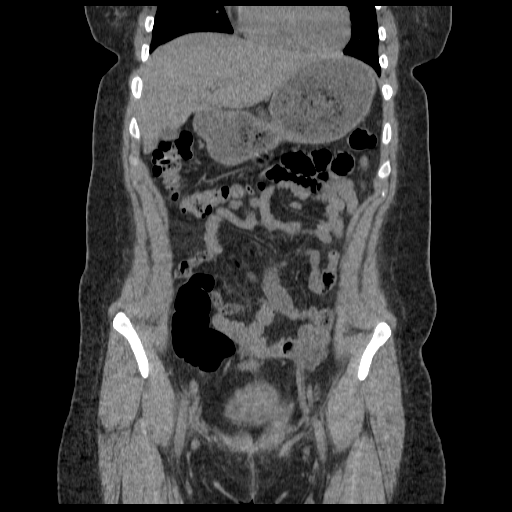
[im 43/96  soft-tissue]
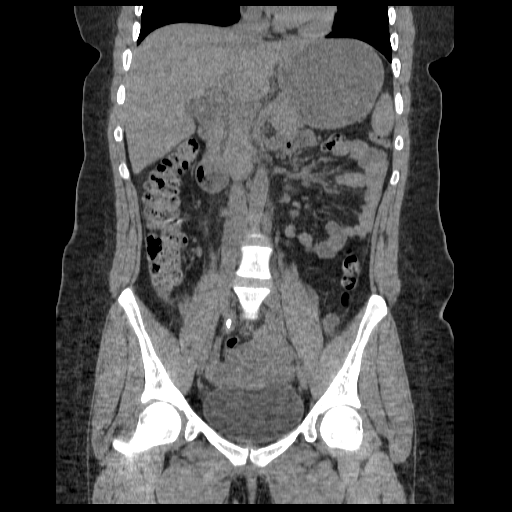
[im 53/96  soft-tissue]
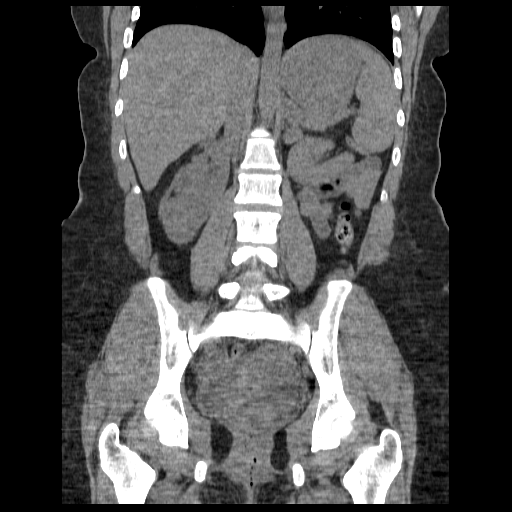

[17 of 46 positions shown; findings below may reference images not displayed]

FINDINGS: Minimal dependent atelectasis.  Heart is normal size.  No
effusions.

There is mild right hydronephrosis due to a 6 mm stone within the
mid-right ureter at the pelvic brim.  Multiple smaller
nonobstructing right renal stones.  No stones or hydronephrosis on
the left.

Liver, spleen, pancreas, adrenals, gallbladder, bowel unremarkable.

Uterus and adnexa as well as urinary bladder grossly unremarkable
unenhanced appearance.  No acute bony abnormality.
IMPRESSION: 6 mm mid right ureteral stone with mild right hydronephrosis.
Right nephrolithiasis.

## 2014-01-19 ENCOUNTER — Encounter (HOSPITAL_COMMUNITY): Payer: Self-pay | Admitting: *Deleted

## 2020-03-03 ENCOUNTER — Emergency Department (HOSPITAL_COMMUNITY)
Admission: EM | Admit: 2020-03-03 | Discharge: 2020-03-03 | Disposition: A | Discharge status: Home / Self Care | Payer: Self-pay | Attending: Emergency Medicine | Admitting: Emergency Medicine

## 2020-03-03 ENCOUNTER — Other Ambulatory Visit: Payer: Self-pay

## 2020-03-03 ENCOUNTER — Emergency Department (HOSPITAL_COMMUNITY): Payer: Self-pay

## 2020-03-03 ENCOUNTER — Encounter (HOSPITAL_COMMUNITY): Payer: Self-pay

## 2020-03-03 DIAGNOSIS — Z87442 Personal history of urinary calculi: Secondary | ICD-10-CM | POA: Insufficient documentation

## 2020-03-03 DIAGNOSIS — E119 Type 2 diabetes mellitus without complications: Secondary | ICD-10-CM | POA: Insufficient documentation

## 2020-03-03 DIAGNOSIS — N10 Acute tubulo-interstitial nephritis: Secondary | ICD-10-CM | POA: Insufficient documentation

## 2020-03-03 DIAGNOSIS — N12 Tubulo-interstitial nephritis, not specified as acute or chronic: Secondary | ICD-10-CM

## 2020-03-03 DIAGNOSIS — Z960 Presence of urogenital implants: Secondary | ICD-10-CM | POA: Insufficient documentation

## 2020-03-03 DIAGNOSIS — Z20822 Contact with and (suspected) exposure to covid-19: Secondary | ICD-10-CM | POA: Insufficient documentation

## 2020-03-03 DIAGNOSIS — N189 Chronic kidney disease, unspecified: Secondary | ICD-10-CM | POA: Insufficient documentation

## 2020-03-03 LAB — I-STAT VENOUS BLOOD GAS, ED
Acid-base deficit: 2 mmol/L (ref 0.0–2.0)
Bicarbonate: 25.5 mmol/L (ref 20.0–28.0)
Calcium, Ion: 1.21 mmol/L (ref 1.15–1.40)
HCT: 45 % (ref 36.0–46.0)
Hemoglobin: 15.3 g/dL — ABNORMAL HIGH (ref 12.0–15.0)
O2 Saturation: 89 %
Potassium: 3.9 mmol/L (ref 3.5–5.1)
Sodium: 138 mmol/L (ref 135–145)
TCO2: 27 mmol/L (ref 22–32)
pCO2, Ven: 50.8 mmHg (ref 44.0–60.0)
pH, Ven: 7.308 (ref 7.250–7.430)
pO2, Ven: 63 mmHg — ABNORMAL HIGH (ref 32.0–45.0)

## 2020-03-03 LAB — URINALYSIS, ROUTINE W REFLEX MICROSCOPIC
Bilirubin Urine: NEGATIVE
Glucose, UA: >=500 mg/dL — AB
Ketones, ur: NEGATIVE mg/dL
Nitrite: NEGATIVE
Protein, ur: 100 mg/dL — AB
Specific Gravity, Urine: 1.026 (ref 1.005–1.030)
WBC, UA: >50 WBC/hpf — ABNORMAL HIGH (ref 0–5)
pH: 5 (ref 5.0–8.0)

## 2020-03-03 LAB — CBC WITH DIFFERENTIAL/PLATELET
Abs Immature Granulocytes: 0.02 10*3/uL (ref 0.00–0.07)
Basophils Absolute: 0 10*3/uL (ref 0.0–0.1)
Basophils Relative: 0 %
Eosinophils Absolute: 0.1 10*3/uL (ref 0.0–0.5)
Eosinophils Relative: 1 %
HCT: 32.2 % — ABNORMAL LOW (ref 36.0–46.0)
Hemoglobin: 10.6 g/dL — ABNORMAL LOW (ref 12.0–15.0)
Immature Granulocytes: 0 %
Lymphocytes Relative: 19 %
Lymphs Abs: 1.2 10*3/uL (ref 0.7–4.0)
MCH: 28 pg (ref 26.0–34.0)
MCHC: 32.9 g/dL (ref 30.0–36.0)
MCV: 85 fL (ref 80.0–100.0)
Monocytes Absolute: 0.3 10*3/uL (ref 0.1–1.0)
Monocytes Relative: 5 %
Neutro Abs: 4.7 10*3/uL (ref 1.7–7.7)
Neutrophils Relative %: 75 %
Platelets: 162 10*3/uL (ref 150–400)
RBC: 3.79 MIL/uL — ABNORMAL LOW (ref 3.87–5.11)
RDW: 12.9 % (ref 11.5–15.5)
WBC: 6.3 10*3/uL (ref 4.0–10.5)
nRBC: 0 % (ref 0.0–0.2)

## 2020-03-03 LAB — BASIC METABOLIC PANEL
Anion gap: 21 — ABNORMAL HIGH (ref 5–15)
Anion gap: 9 (ref 5–15)
BUN: 8 mg/dL (ref 6–20)
BUN: 9 mg/dL (ref 6–20)
CO2: 15 mmol/L — ABNORMAL LOW (ref 22–32)
CO2: 24 mmol/L (ref 22–32)
Calcium: 5.9 mg/dL — CL (ref 8.9–10.3)
Calcium: 9 mg/dL (ref 8.9–10.3)
Chloride: 112 mmol/L — ABNORMAL HIGH (ref 98–111)
Chloride: 104 mmol/L (ref 98–111)
Creatinine, Ser: 0.52 mg/dL (ref 0.44–1.00)
Creatinine, Ser: 0.89 mg/dL (ref 0.44–1.00)
GFR, Estimated: >60 mL/min (ref >60)
GFR, Estimated: >60 mL/min (ref >60)
Glucose, Bld: 302 mg/dL — ABNORMAL HIGH (ref 70–99)
Glucose, Bld: 324 mg/dL — ABNORMAL HIGH (ref 70–99)
Potassium: 2.6 mmol/L — CL (ref 3.5–5.1)
Potassium: 4.1 mmol/L (ref 3.5–5.1)
Sodium: 148 mmol/L — ABNORMAL HIGH (ref 135–145)
Sodium: 137 mmol/L (ref 135–145)

## 2020-03-03 LAB — RESP PANEL BY RT-PCR (FLU A&B, COVID) ARPGX2
Influenza A by PCR: NEGATIVE
Influenza B by PCR: NEGATIVE
SARS Coronavirus 2 by RT PCR: NEGATIVE

## 2020-03-03 LAB — BETA-HYDROXYBUTYRIC ACID: Beta-Hydroxybutyric Acid: 0.08 mmol/L (ref 0.05–0.27)

## 2020-03-03 LAB — PREGNANCY, URINE: Preg Test, Ur: NEGATIVE

## 2020-03-03 LAB — HEMOGLOBIN A1C
Hgb A1c MFr Bld: 12.5 % — ABNORMAL HIGH (ref 4.8–5.6)
Mean Plasma Glucose: 312.05 mg/dL

## 2020-03-03 MED ORDER — METFORMIN HCL 500 MG PO TABS: 500.0000 mg | ORAL_TABLET | Freq: Two times a day (BID) | ORAL | 0 refills | Status: DC

## 2020-03-03 MED ORDER — SODIUM CHLORIDE 0.9 % IV SOLN
1.0000 g | Freq: Once | INTRAVENOUS | Status: AC
  Administered 2020-03-03: 1 g via INTRAVENOUS
  Filled 2020-03-03: qty 10

## 2020-03-03 MED ORDER — OXYCODONE-ACETAMINOPHEN 5-325 MG PO TABS
1.0000 | ORAL_TABLET | Freq: Once | ORAL | Status: AC
Start: 2020-03-03 — End: 2020-03-03
  Administered 2020-03-03: 1 via ORAL
  Filled 2020-03-03: qty 1

## 2020-03-03 MED ORDER — KETOROLAC TROMETHAMINE 30 MG/ML IJ SOLN
30.0000 mg | Freq: Once | INTRAMUSCULAR | Status: AC
  Administered 2020-03-03: 30 mg via INTRAVENOUS
  Filled 2020-03-03: qty 1

## 2020-03-03 MED ORDER — CEPHALEXIN 500 MG PO CAPS: 500.0000 mg | ORAL_CAPSULE | Freq: Two times a day (BID) | ORAL | 0 refills | Status: AC

## 2020-03-03 MED ORDER — SODIUM CHLORIDE 0.9 % IV BOLUS: 1000.0000 mL | Freq: Once | INTRAVENOUS | Status: DC

## 2020-03-03 MED ORDER — OXYCODONE-ACETAMINOPHEN 5-325 MG PO TABS: 1.0000 | ORAL_TABLET | Freq: Four times a day (QID) | ORAL | 0 refills | Status: DC | PRN

## 2020-03-03 MED ORDER — ONDANSETRON 4 MG PO TBDP
4.0000 mg | ORAL_TABLET | Freq: Once | ORAL | Status: AC
  Administered 2020-03-03: 4 mg via ORAL
  Filled 2020-03-03: qty 1

## 2020-03-03 NOTE — ED Triage Notes (Signed)
Pt reports right lower back pain that radiates to her right flank since last night, pressure when she urinates. LMP last month, denies abnormal bleeding or discharge

## 2020-03-03 NOTE — Discharge Instructions (Addendum)
We are treating  you for a kidney infection today given your symptoms. Please pick up antibiotics and take as prescribed for the next 2 weeks. We have sent your urine for culture and will call you in 2-3 days time IF the antibiotic needs to be changed.   Your blood glucose levels were elevated today consistent with new onset diabetes. I have prescribed Metformin for you to start taking twice a day.  Please follow up with [facility] to establish primary care. They will need to take over your prescription of Metformin. We have sent off an A1C level that they can follow up on as well. They can also recheck your urine to see if this infection has cleared.   Return to the ED IMMEDIATELY for any worsening symptoms including worsening pain, fevers > 100.4, excessive vomiting, or any other new/concerning symptoms.

## 2020-03-03 NOTE — ED Provider Notes (Signed)
[facility] EMERGENCY DEPARTMENT Provider Note   CSN: [id number] Arrival date & time: 03/03/20  0848     History Chief Complaint  Patient presents with  . Back Pain  . Flank Pain    Caitlin Mejia is a 34 y.o. female who presents to the ED today with complaint of gradual onset, constant, sharp, right flank pain that began last night. Pt also complains of nausea and urinary frequency.  Patient reports history of kidney stones and states this feels similar.  She took 500 mg Tylenol earlier without relief.  No other complaints at this time.  Last normal.  Middle of November.  Denies any risk of being pregnant.  Denies fevers, chills, vomiting, hematuria, dysuria, pelvic pain, vaginal discharge, any other associated symptoms.   The history is provided by the patient and medical records.       Past Medical History:  Diagnosis Date  . Chronic kidney disease   . Gall stones     Patient Active Problem List   Diagnosis Date Noted  . History of gestational diabetes 04/23/2012  . History of shoulder dystocia in prior pregnancy, currently pregnant 10/16/2011    Past Surgical History:  Procedure Laterality Date  . URETERAL STENT PLACEMENT    . wisdom teeth       OB History    Gravida  6   Para  5   Term  5   Preterm  0   AB  0   Living  5     SAB  0   IAB  0   Ectopic  0   Multiple  0   Live Births  1           Family History  Problem Relation Age of Onset  . Cancer Father     Social History   Tobacco Use  . Smoking status: Never Smoker  . Smokeless tobacco: Never Used  Substance Use Topics  . Alcohol use: Yes    Comment: Rarely  . Drug use: No    Home Medications Prior to Admission medications   Medication Sig Start Date End Date Taking? Authorizing Provider  acetaminophen (TYLENOL) 500 MG tablet Take 500 mg by mouth as needed (pain).   Yes [provider]  cephALEXin (KEFLEX) 500 MG capsule Take 1 capsule  (500 mg total) by mouth 2 (two) times daily for 14 days. 03/03/20 03/17/20  Tanda Rockers, PA-C  ibuprofen (ADVIL,MOTRIN) 200 MG tablet Take 3 tablets (600 mg total) by mouth every 6 (six) hours as needed for pain. Patient not taking: No sig reported 05/01/12   Katrinka Blazing, [place], CNM  metFORMIN (GLUCOPHAGE) 500 MG tablet Take 1 tablet (500 mg total) by mouth 2 (two) times daily with a meal. 03/03/20 04/02/20  Tanda Rockers, PA-C    Allergies    Patient has no known allergies.  Review of Systems   Review of Systems  Constitutional: Negative for chills and fever.  Gastrointestinal: Positive for abdominal pain and nausea. Negative for diarrhea and vomiting.  Genitourinary: Positive for flank pain and frequency. Negative for dysuria, hematuria, urgency, vaginal bleeding and vaginal discharge.  All other systems reviewed and are negative.   Physical Exam Updated Vital Signs BP 122/78 (BP Location: Left Arm)   Pulse 63   Temp 98.2 F (36.8 C) (Oral)   Resp 16   LMP 02/02/2020   SpO2 100%   Physical Exam Vitals and nursing note reviewed.  Constitutional:  Comments: Uncomfortable appearing female  HENT:     Head: Normocephalic and atraumatic.  Eyes:     Conjunctiva/sclera: Conjunctivae normal.  Cardiovascular:     Rate and Rhythm: Normal rate and regular rhythm.  Pulmonary:     Effort: Pulmonary effort is normal.     Breath sounds: Normal breath sounds. No wheezing, rhonchi or rales.  Abdominal:     Palpations: Abdomen is soft.     Tenderness: There is abdominal tenderness. There is right CVA tenderness. There is no guarding or rebound.     Comments: Soft, + right CVA and right flank TTP. +BS throughout, no r/g/r, neg murphy's, neg mcburney's  Musculoskeletal:     Cervical back: Neck supple.  Skin:    General: Skin is warm and dry.  Neurological:     Mental Status: She is alert.     ED Results / Procedures / Treatments   Labs (all labs ordered are listed, but only  abnormal results are displayed) Labs Reviewed  URINALYSIS, ROUTINE W REFLEX MICROSCOPIC - Abnormal; Notable for the following components:      Result Value   APPearance CLOUDY (*)    Glucose, UA >=500 (*)    Hgb urine dipstick LARGE (*)    Protein, ur 100 (*)    Leukocytes,Ua LARGE (*)    WBC, UA >50 (*)    Bacteria, UA RARE (*)    All other components within normal limits  BASIC METABOLIC PANEL - Abnormal; Notable for the following components:   Sodium 148 (*)    Potassium 2.6 (*)    Chloride 112 (*)    CO2 15 (*)    Glucose, Bld 302 (*)    Calcium 5.9 (*)    Anion gap 21 (*)    All other components within normal limits  CBC WITH DIFFERENTIAL/PLATELET - Abnormal; Notable for the following components:   RBC 3.79 (*)    Hemoglobin 10.6 (*)    HCT 32.2 (*)    All other components within normal limits  BASIC METABOLIC PANEL - Abnormal; Notable for the following components:   Glucose, Bld 324 (*)    All other components within normal limits  HEMOGLOBIN A1C - Abnormal; Notable for the following components:   Hgb A1c MFr Bld 12.5 (*)    All other components within normal limits  I-STAT VENOUS BLOOD GAS, ED - Abnormal; Notable for the following components:   pO2, Ven 63.0 (*)    Hemoglobin 15.3 (*)    All other components within normal limits  RESP PANEL BY RT-PCR (FLU A&B, COVID) ARPGX2  URINE CULTURE  PREGNANCY, URINE  BETA-HYDROXYBUTYRIC ACID  CALCIUM, IONIZED    EKG None  Radiology CT Renal Stone Study  Result Date: 03/03/2020 CLINICAL DATA:  34 year old female with history of right-sided low back pain radiating into the right flank. EXAM: CT ABDOMEN AND PELVIS WITHOUT CONTRAST TECHNIQUE: Multidetector CT imaging of the abdomen and pelvis was performed following the standard protocol without IV contrast. COMPARISON:  CT the abdomen and pelvis 05/12/2011. FINDINGS: Lower chest: Unremarkable. Hepatobiliary: No definite suspicious cystic or solid hepatic lesions are  confidently identified on today's noncontrast CT examination. Unenhanced appearance of the gallbladder is normal. Pancreas: No definite pancreatic mass or peripancreatic fluid collections or inflammatory changes are noted on today's noncontrast CT examination. Spleen: Unremarkable. Adrenals/Urinary Tract: 6 mm calculus in the lower pole collecting system of the left kidney. No additional calculi are noted within the right renal collecting system, along the course  of either ureter, or within the lumen of the urinary bladder. No hydroureteronephrosis. Altered right renal axis (normal anatomical variant) incidentally noted, similar to the prior study. Urinary bladder is normal in appearance. Bilateral adrenal glands are normal in appearance. Stomach/Bowel: Unenhanced appearance of the stomach is normal. No pathologic dilatation of small bowel or colon. Vascular/Lymphatic: No atherosclerotic calcifications in the abdominal aorta or pelvic vasculature. No lymphadenopathy noted in the abdomen or pelvis. Reproductive: Unenhanced appearance of the uterus and ovaries is unremarkable. Other: No significant volume of ascites.  No pneumoperitoneum. Musculoskeletal: There are no aggressive appearing lytic or blastic lesions noted in the visualized portions of the skeleton. IMPRESSION: 1. No acute findings are noted in the abdomen or pelvis to account for the patient's symptoms. 2. 6 mm nonobstructive calculus in the lower pole collecting system of left kidney. No ureteral stones or findings of urinary tract obstruction are noted at this time. 3. Altered right renal axis, similar to prior studies. Electronically Signed   By: Trudie Reed M.D.   On: 03/03/2020 14:45    Procedures Procedures (including critical care time)  Medications Ordered in ED Medications  cefTRIAXone (ROCEPHIN) 1 g in sodium chloride 0.9 % 100 mL IVPB (0 g Intravenous Stopped 03/03/20 1341)  ketorolac (TORADOL) 30 MG/ML injection 30 mg (30 mg  Intravenous Given 03/03/20 1301)    ED Course  I have reviewed the triage vital signs and the nursing notes.  Pertinent labs & imaging results that were available during my care of the patient were reviewed by me and considered in my medical decision making (see chart for details).    MDM Rules/Calculators/A&P                          34 year old female presents to the ED today with complaint of right flank pain, nausea, urinary frequency that began yesterday.  History of kidney stones and states this feels similar.  No other complaints including fevers or chills or vomiting.  On arrival to the ED vitals are stable.  Patient is nontoxic-appearing.  Urinalysis was obtained which does show large hemoglobin with 21-50 red blood cells however large leuks and greater than 50 white blood cells and rare bacteria with white blood cell clumps.  On exam patient has obvious right CVA tenderness palpation.  No rebound or guarding to abdomen.  Concern for infected kidney stone versus pyelonephritis at this time.  Will obtain lab work to further assess at this time.  Will provide Toradol for pain and Rocephin to cover for infection.  Will send urine for culture. Given history of stones with pt stating this feels similar - will obtain CT scan.   CBC without leukocytosis. Hgb stable at 10.6.  BMP has returned with multiple abnormalities. Glucose 302, Bicarb 15, and gap of 21. No history of diabetes. Will obtain VBG and beta hydroxybutyric acid with concern for DKA. Fluids ordered.     VBG has returned completely normal.  Calcium within normal limits, pH within normal limits, bicarb 25, calcium 3.9.  Concerned that initial BMP was a lab error.  We will plan for repeat at this time.  Beta hydroxybutyric acid has also returned normal at 0.08.  Will hold off on insulin at this time.  Repeat BMP with glucose 324.  No other abnormalities.  Bicarb normal at 24.  Potassium normal at 4.1.  Calcium 9.0.  No gap.  This  setting patient does not need admission.  Will  start on Metformin in the outpatient setting patient follow-up with PCP to establish care.  Will obtain a hemoglobin A1c so PCP can follow-up with this.  We will also treat for Pilo at this time.  Had lengthy discussion with patient regarding this as well as lifestyle modification.  She is in agreement at this time and is serious about not wanting lifelong complications from diabetes, she has family that has diabetes and she does not want this for herself.  She will start Metformin and have her urine rechecked by PCP in 2 weeks.  Strict return precautions have been discussed with patient.  She is in agreement with plan is stable for discharge.   This note was prepared using Dragon voice recognition software and may include unintentional dictation errors due to the inherent limitations of voice recognition software.   Final Clinical Impression(s) / ED Diagnoses Final diagnoses:  Diabetes mellitus, new onset (HCC)  Pyelonephritis    Rx / DC Orders ED Discharge Orders         Ordered    cephALEXin (KEFLEX) 500 MG capsule  2 times daily        03/03/20 1824    metFORMIN (GLUCOPHAGE) 500 MG tablet  2 times daily with meals        03/03/20 1824           Discharge Instructions     We are treating  you for a kidney infection today given your symptoms. Please pick up antibiotics and take as prescribed for the next 2 weeks. We have sent your urine for culture and will call you in 2-3 days time IF the antibiotic needs to be changed.   Your blood glucose levels were elevated today consistent with new onset diabetes. I have prescribed Metformin for you to start taking twice a day.  [facility] to establish primary care. They will need to take over your prescription of Metformin. We have sent off an A1C level that they can follow up on as well. They can also recheck your urine to see if this infection has cleared.    Return to the ED IMMEDIATELY for any worsening symptoms including worsening pain, fevers > 100.4, excessive vomiting, or any other new/concerning symptoms.        Tanda RockersVenter, Channelle Bottger, PA-C 03/03/20 1829    Derwood KaplanNanavati, Ankit, MD 03/04/20 [id number]

## 2020-03-03 NOTE — ED Notes (Signed)
Reviewed discharge instructions with patient and significant other. Follow-up care and medications reviewed. Patient and significant other verbalized understanding. Patient A&Ox4, VSS upon discharge. 

## 2020-03-04 LAB — CALCIUM, IONIZED: Calcium, Ionized, Serum: 5 mg/dL (ref 4.5–5.6)

## 2020-03-05 LAB — URINE CULTURE: Culture: >=100000 — AB

## 2020-03-07 ENCOUNTER — Telehealth: Payer: Self-pay | Admitting: Emergency Medicine

## 2020-03-07 NOTE — Telephone Encounter (Signed)
Post ED Visit - Positive Culture Follow-up  Culture report reviewed by antimicrobial stewardship pharmacist: [facility] []  , Pharm.D. []  Enzo Bi, Pharm.D., BCPS AQ-ID []  , Pharm.D., BCPS []  Celedonio Miyamoto, Pharm.D., BCPS []  [place], Garvin Fila.D., BCPS, AAHIVP []  , Pharm.D., BCPS, AAHIVP []  Georgina Pillion, PharmD, BCPS []  , PharmD, BCPS []  [place], PharmD, BCPS [x]  [address], PharmD []  , PharmD, BCPS []  Estella Husk, PharmD  Pharmacy Team []  Lysle Pearl, PharmD []  , PharmD []  Phillips Climes, PharmD []  , Rph []  Agapito Games) , PharmD []  Joaquim Lai, PharmD []  , PharmD []  Mervyn Gay, PharmD []  , PharmD []  Vinnie Level, PharmD []  [facility], PharmD []  , PharmD []  Len Childs, PharmD   Positive urine culture Treated with Cephalexin, organism sensitive to the same and no further patient follow-up is required at this time.  Jazmyn Offner 03/07/2020, 3:53 PM

## 2020-03-20 ENCOUNTER — Emergency Department (HOSPITAL_COMMUNITY): Payer: Medicaid Other

## 2020-03-20 ENCOUNTER — Encounter (HOSPITAL_COMMUNITY): Payer: Self-pay | Admitting: Emergency Medicine

## 2020-03-20 ENCOUNTER — Other Ambulatory Visit: Payer: Self-pay

## 2020-03-20 DIAGNOSIS — W108XXA Fall (on) (from) other stairs and steps, initial encounter: Secondary | ICD-10-CM | POA: Insufficient documentation

## 2020-03-20 DIAGNOSIS — Y9289 Other specified places as the place of occurrence of the external cause: Secondary | ICD-10-CM | POA: Insufficient documentation

## 2020-03-20 DIAGNOSIS — N189 Chronic kidney disease, unspecified: Secondary | ICD-10-CM | POA: Diagnosis not present

## 2020-03-20 DIAGNOSIS — S82832A Other fracture of upper and lower end of left fibula, initial encounter for closed fracture: Secondary | ICD-10-CM | POA: Diagnosis not present

## 2020-03-20 DIAGNOSIS — Y9389 Activity, other specified: Secondary | ICD-10-CM | POA: Insufficient documentation

## 2020-03-20 DIAGNOSIS — S99912A Unspecified injury of left ankle, initial encounter: Secondary | ICD-10-CM | POA: Diagnosis present

## 2020-03-20 NOTE — ED Triage Notes (Signed)
Pt arriving with left ankle pain. Pt reports she slipped going down the stairs and felt a pop. Having difficulty putting any pressure on left ankle.

## 2020-03-21 ENCOUNTER — Emergency Department (HOSPITAL_COMMUNITY)
Admission: EM | Admit: 2020-03-21 | Discharge: 2020-03-21 | Disposition: A | Discharge status: Home / Self Care | Payer: Medicaid Other | Attending: Emergency Medicine | Admitting: Emergency Medicine

## 2020-03-21 DIAGNOSIS — S82832A Other fracture of upper and lower end of left fibula, initial encounter for closed fracture: Secondary | ICD-10-CM

## 2020-03-21 MED ORDER — ONDANSETRON 4 MG PO TBDP
4.0000 mg | ORAL_TABLET | Freq: Once | ORAL | Status: AC
  Administered 2020-03-21: 4 mg via ORAL
  Filled 2020-03-21: qty 1

## 2020-03-21 MED ORDER — DOCUSATE SODIUM 100 MG PO CAPS
100.0000 mg | ORAL_CAPSULE | Freq: Two times a day (BID) | ORAL | 0 refills | Status: DC
Start: 2020-03-21 — End: 2020-04-02

## 2020-03-21 MED ORDER — ONDANSETRON 4 MG PO TBDP
4.0000 mg | ORAL_TABLET | Freq: Three times a day (TID) | ORAL | 0 refills | Status: DC | PRN
Start: 2020-03-21 — End: 2020-04-02

## 2020-03-21 MED ORDER — OXYCODONE-ACETAMINOPHEN 5-325 MG PO TABS: 2.0000 | ORAL_TABLET | Freq: Four times a day (QID) | ORAL | 0 refills | Status: DC | PRN

## 2020-03-21 MED ORDER — OXYCODONE-ACETAMINOPHEN 5-325 MG PO TABS
2.0000 | ORAL_TABLET | Freq: Once | ORAL | Status: AC
  Administered 2020-03-21: 2 via ORAL
  Filled 2020-03-21: qty 2

## 2020-03-21 NOTE — Discharge Instructions (Signed)
Please do not walk on your left leg.  You will need to use crutches.  Please follow-up with orthopedics as an outpatient.  I recommend keeping your leg elevated when at rest which will help with pain and swelling.  You are being provided a prescription for opiates (also known as narcotics) for pain control.  Opiates can be addictive and should only be used when absolutely necessary for pain control when other alternatives do not work.  We recommend you only use them for the recommended amount of time and only as prescribed.  Please do not take with other sedative medications or alcohol.  Please do not drive, operate machinery, make important decisions while taking opiates.  Please note that these medications can be addictive and have high abuse potential.  Patients can become addicted to narcotics after only taking them for a few days.  Please keep these medications locked away from children, teenagers or any family members with history of substance abuse.  Narcotic pain medicine may also make you constipated.  You may use over-the-counter medications such as MiraLAX, Colace to prevent constipation.  If you become constipated you may use over-the-counter enemas as needed.  Itching and nausea are common side effects of narcotic pain medication.  If you develop uncontrolled vomiting or a rash, please stop these medications.

## 2020-03-21 NOTE — Progress Notes (Signed)
Orthopedic Tech Progress Note Patient Details:  MARGARETT VITI 09/29/1985 [id number]  Ortho Devices Type of Ortho Device: Post (short leg) splint,Stirrup splint Ortho Device/Splint Location: lle Ortho Device/Splint Interventions: Ordered,Application,Adjustment   Post Interventions Patient Tolerated: Well Instructions Provided: Care of device,Adjustment of device   Trinna Post 03/21/2020, 5:49 AM

## 2020-03-21 NOTE — ED Notes (Signed)
Patient is calling her ride.   

## 2020-03-21 NOTE — ED Provider Notes (Signed)
TIME SEEN: 4:22 AM  CHIEF COMPLAINT: left ankle pain  HPI: Patient is a 35 year old female who presents to the emergency department with left ankle injury.  States she slipped going down some steps twisting her left ankle.  Has not been able to ambulate since.  Did not fall to the ground or hit her head.  No neck or back pain.  No numbness or weakness.  ROS: See HPI Constitutional: no fever  Eyes: no drainage  ENT: no runny nose   Cardiovascular:  no chest pain  Resp: no SOB  GI: no vomiting GU: no dysuria Integumentary: no rash  Allergy: no hives  Musculoskeletal: no leg swelling  Neurological: no slurred speech ROS otherwise negative  PAST MEDICAL HISTORY/PAST SURGICAL HISTORY:  Past Medical History:  Diagnosis Date  . Chronic kidney disease   . Gall stones     MEDICATIONS:  Prior to Admission medications   Medication Sig Start Date End Date Taking? Authorizing Provider  acetaminophen (TYLENOL) 500 MG tablet Take 500 mg by mouth as needed (pain).    [provider]  ibuprofen (ADVIL,MOTRIN) 200 MG tablet Take 3 tablets (600 mg total) by mouth every 6 (six) hours as needed for pain. Patient not taking: No sig reported 05/01/12   Katrinka Blazing, [place], CNM  metFORMIN (GLUCOPHAGE) 500 MG tablet Take 1 tablet (500 mg total) by mouth 2 (two) times daily with a meal. 03/03/20 04/02/20  Venter, Margaux, PA-C  oxyCODONE-acetaminophen (PERCOCET/ROXICET) 5-325 MG tablet Take 1 tablet by mouth every 6 (six) hours as needed for severe pain. 03/03/20   Tanda Rockers, PA-C    ALLERGIES:  No Known Allergies  SOCIAL HISTORY:  Social History   Tobacco Use  . Smoking status: Never Smoker  . Smokeless tobacco: Never Used  Substance Use Topics  . Alcohol use: Yes    Comment: Rarely    FAMILY HISTORY: Family History  Problem Relation Age of Onset  . Cancer Father     EXAM: BP 120/88 (BP Location: Left Arm)   Pulse 80   Temp 99.7 F (37.6 C) (Oral)   Resp 17   LMP  03/14/2020   SpO2 100%  CONSTITUTIONAL: Alert and oriented and responds appropriately to questions. Well-appearing; well-nourished; GCS 15 HEAD: Normocephalic; atraumatic EYES: Conjunctivae clear, PERRL, EOMI ENT: normal nose; no rhinorrhea; moist mucous membranes; pharynx without lesions noted; no dental injury; no septal hematoma NECK: Supple, no meningismus, no LAD; no midline spinal tenderness, step-off or deformity; trachea midline CARD: RRR; S1 and S2 appreciated; no murmurs, no clicks, no rubs, no gallops RESP: Normal chest excursion without splinting or tachypnea; breath sounds clear and equal bilaterally; no wheezes, no rhonchi, no rales; no hypoxia or respiratory distress CHEST:  chest wall stable, no crepitus or ecchymosis or deformity, nontender to palpation; no flail chest ABD/GI: Normal bowel sounds; non-distended; soft, non-tender, no rebound, no guarding; no ecchymosis or other lesions noted PELVIS:  stable, nontender to palpation BACK:  The back appears normal and is non-tender to palpation, there is no CVA tenderness; no midline spinal tenderness, step-off or deformity EXT: Patient is tender to palpation over the distal left fibula with soft tissue swelling and ecchymosis.  There is no proximal fibular tenderness on exam.  Unable to test ligamentous laxity of the left ankle due to pain.  She has a 2+ left DP pulse and normal sensation throughout the foot.  No open wounds.  Compartments soft.  Otherwise extremities are nontender to palpation.  Left lower extremity warm  and well-perfused. SKIN: Normal color for age and race; warm NEURO: Moves all extremities equally PSYCH: The patient's mood and manner are appropriate. Grooming and personal hygiene are appropriate.  MEDICAL DECISION MAKING: Patient here with comminuted minimally displaced fracture of the distal fibula.  Will place in posterior splint with stirrups.  Will provide with pain medication, crutches.  Will send secure chat  message to Dr. Dion Saucier who is on-call for orthopedics at [facility].  She does not have a local orthopedist.  Discussed with patient that she will need to be nonweightbearing and follow-up with orthopedics as an outpatient.  At this time, I do not feel there is any life-threatening condition present. I have reviewed, interpreted and discussed all results (EKG, imaging, lab, urine as appropriate) and exam findings with patient/family. I have reviewed nursing notes and appropriate previous records.  I feel the patient is safe to be discharged home without further emergent workup and can continue workup as an outpatient as needed. Discussed usual and customary return precautions. Patient/family verbalize understanding and are comfortable with this plan.  Outpatient follow-up has been provided as needed. All questions have been answered.    SPLINT APPLICATION Date/Time: 4:24 AM Authorized by: Baxter Hire Zayvian Mcmurtry Consent: Verbal consent obtained. Risks and benefits: risks, benefits and alternatives were discussed Consent given by: patient Splint applied by: orthopedic technician Location details: left leg Splint type: posterior short leg with stirrups Supplies used: ortho glass Post-procedure: The splinted body part was neurovascularly unchanged following the procedure. Patient tolerance: Patient tolerated the procedure well with no immediate complications.      Caitlin Mejia was evaluated in Emergency Department on 03/21/2020 for the symptoms described in the history of present illness. She was evaluated in the context of the global COVID-19 pandemic, which necessitated consideration that the patient might be at risk for infection with the SARS-CoV-2 virus that causes COVID-19. Institutional protocols and algorithms that pertain to the evaluation of patients at risk for COVID-19 are in a state of rapid change based on information released by regulatory bodies including the CDC and federal and state  organizations. These policies and algorithms were followed during the patient's care in the ED.       Caitlin Mejia, Layla Maw, DO 03/21/20 [phone]

## 2020-04-02 ENCOUNTER — Ambulatory Visit: Payer: Medicaid Other | Attending: Family Medicine | Admitting: Internal Medicine

## 2020-04-02 ENCOUNTER — Other Ambulatory Visit: Payer: Self-pay | Admitting: Internal Medicine

## 2020-04-02 ENCOUNTER — Other Ambulatory Visit (HOSPITAL_COMMUNITY)
Admission: RE | Admit: 2020-04-02 | Discharge: 2020-04-02 | Disposition: A | Discharge status: Home / Self Care | Payer: HRSA Program | Source: Ambulatory Visit | Attending: Orthopedic Surgery | Admitting: Orthopedic Surgery

## 2020-04-02 ENCOUNTER — Encounter: Payer: Self-pay | Admitting: Internal Medicine

## 2020-04-02 ENCOUNTER — Other Ambulatory Visit: Payer: Self-pay

## 2020-04-02 DIAGNOSIS — IMO0002 Reserved for concepts with insufficient information to code with codable children: Secondary | ICD-10-CM | POA: Insufficient documentation

## 2020-04-02 DIAGNOSIS — Z01812 Encounter for preprocedural laboratory examination: Secondary | ICD-10-CM | POA: Insufficient documentation

## 2020-04-02 DIAGNOSIS — E1165 Type 2 diabetes mellitus with hyperglycemia: Secondary | ICD-10-CM

## 2020-04-02 DIAGNOSIS — Z20822 Contact with and (suspected) exposure to covid-19: Secondary | ICD-10-CM | POA: Diagnosis not present

## 2020-04-02 MED ORDER — TRUE METRIX METER W/DEVICE KIT: PACK | 0 refills | Status: DC

## 2020-04-02 MED ORDER — METFORMIN HCL ER 500 MG PO TB24: 500.0000 mg | ORAL_TABLET | Freq: Every day | ORAL | 3 refills | Status: DC

## 2020-04-02 MED ORDER — TRUEPLUS LANCETS 28G MISC: 2 refills | Status: DC

## 2020-04-02 MED ORDER — TRUE METRIX BLOOD GLUCOSE TEST VI STRP: ORAL_STRIP | 2 refills | Status: DC

## 2020-04-02 MED FILL — metFORMIN HCL ER 500 MG TB2: 500 | 30 days supply | Qty: 30 | Fill #0

## 2020-04-02 MED FILL — TRUEplus LANCETS 28G MISC: 30 days supply | Qty: 100 | Fill #0

## 2020-04-02 MED FILL — TRUE METRIX GLUCOSE TEST ST: 30 days supply | Qty: 100 | Fill #0

## 2020-04-02 MED FILL — !TRUE METRIX BLOOD GLUCOSE: 1 days supply | Qty: 1 | Fill #0

## 2020-04-02 NOTE — Assessment & Plan Note (Addendum)
She almost certainly has type 2 diabetes but needs further evaluation.  I will check and a glutamic acid decarboxylase antibodies.  We will also check a C-peptide.  I had a long discussion with her about diet.  If she has type 2 diabetes I think she can make substantial improvement in her blood sugar over the next 4 days.  I have asked her to avoid all sweet foods.  I have asked her to avoid all simple carbohydrates that include white rice, potatoes, bread etc.  I think if she does that between now and 4 days from now her blood sugar at the time of surgery will be adequate.  I will check -  Will retry a different formulation of Metformin Will use XR once daily (500 mg).

## 2020-04-02 NOTE — Progress Notes (Signed)
HFU- 03/03/2020 DM  Last A1C- 12.5 in December 03/03/2020 Stopped Metformin 9 days ago.    Patient comes in after a fall suffering a lower extremity fracture.  While in the emergency department she was found to be quite hyperglycemic and even acidotic.  She was treated with metformin twice daily.  She was also treated for urinary infection and given some pain medications.  While taking all of that medication she felt quite unwell.  She felt fatigued, lethargic, orthostatic.  She has not fallen again. She does admit to profound polyuria and polydipsia.  This is on the background of gestational diabetes 8 years ago.  She does not really have a family history of diabetes.  The goal today is to make sure that she is metabolically prepared for surgery in 4 days.  Past Medical History:  Diagnosis Date  . Chronic kidney disease   . Diabetes mellitus without complication (HCC)   . Gall stones     Social History   Socioeconomic History  . Marital status: Single    Spouse name: Not on file  . Number of children: Not on file  . Years of education: Not on file  . Highest education level: Not on file  Occupational History  . Not on file  Tobacco Use  . Smoking status: Never Smoker  . Smokeless tobacco: Never Used  Substance and Sexual Activity  . Alcohol use: Yes    Comment: Rarely  . Drug use: No  . Sexual activity: Yes    Birth control/protection: None  Other Topics Concern  . Not on file  Social History Narrative  . Not on file   Social Determinants of Health   Financial Resource Strain: Not on file  Food Insecurity: Not on file  Transportation Needs: Not on file  Physical Activity: Not on file  Stress: Not on file  Social Connections: Not on file  Intimate Partner Violence: Not on file    Past Surgical History:  Procedure Laterality Date  . URETERAL STENT PLACEMENT    . wisdom teeth      Family History  Problem Relation Age of Onset  . Cerebral aneurysm Father   .  Diabetes Paternal Uncle     No Known Allergies  Current Outpatient Medications on File Prior to Visit  Medication Sig Dispense Refill  . oxyCODONE-acetaminophen (PERCOCET/ROXICET) 5-325 MG tablet Take 2 tablets by mouth every 8 (eight) hours as needed for severe pain.     No current facility-administered medications on file prior to visit.     patient denies chest pain, shortness of breath, orthopnea. Denies lower extremity edema, abdominal pain, change in appetite, change in bowel movements. Patient denies rashes, musculoskeletal complaints. No other specific complaints in a complete review of systems.   BP 110/78   Pulse 75   Temp 98 F (36.7 C)   Resp 16   Wt 225 lb (102.1 kg)   LMP 03/14/2020   SpO2 98%   BMI 41.15 kg/m   Well-developed well-nourished female in no acute distress. HEENT exam atraumatic, normocephalic, extraocular muscles are intact. Neck is supple. No jugular venous distention no thyromegaly. Chest clear to auscultation without increased work of breathing. Cardiac exam S1 and S2 are regular. Abdominal exam active bowel sounds, soft, nontender. Extremities no edema. Neurologic exam she is alert without any motor sensory deficits. Gait is with crutches  Diabetes type 2, uncontrolled (HCC) She almost certainly has type 2 diabetes but needs further evaluation.  I will check and  a glutamic acid decarboxylase antibodies.  We will also check a C-peptide.  I had a long discussion with her about diet.  If she has type 2 diabetes I think she can make substantial improvement in her blood sugar over the next 4 days.  I have asked her to avoid all sweet foods.  I have asked her to avoid all simple carbohydrates that include white rice, potatoes, bread etc.  I think if she does that between now and 4 days from now her blood sugar at the time of surgery will be adequate.  I will check -  Will retry a different formulation of Metformin Will use XR once daily (500 mg).

## 2020-04-02 NOTE — Patient Instructions (Signed)
For the next few days avoid all sweets including sweet drinks Avoid simple carbohydrates: rice, potatoes, bread.  Please do eat green vegetables, lean meats, eggs, etc.

## 2020-04-03 LAB — SARS CORONAVIRUS 2 (TAT 6-24 HRS): SARS Coronavirus 2: NEGATIVE

## 2020-04-05 ENCOUNTER — Ambulatory Visit: Payer: Self-pay | Admitting: Pharmacist

## 2020-04-06 ENCOUNTER — Ambulatory Visit (HOSPITAL_BASED_OUTPATIENT_CLINIC_OR_DEPARTMENT_OTHER): Payer: Medicaid Other | Admitting: Certified Registered"

## 2020-04-06 ENCOUNTER — Other Ambulatory Visit: Payer: Self-pay

## 2020-04-06 ENCOUNTER — Encounter (HOSPITAL_BASED_OUTPATIENT_CLINIC_OR_DEPARTMENT_OTHER): Payer: Self-pay | Admitting: Orthopedic Surgery

## 2020-04-06 ENCOUNTER — Ambulatory Visit (HOSPITAL_BASED_OUTPATIENT_CLINIC_OR_DEPARTMENT_OTHER)
Admission: RE | Admit: 2020-04-06 | Discharge: 2020-04-06 | Disposition: A | Discharge status: Home / Self Care | Payer: Medicaid Other | Attending: Orthopedic Surgery | Admitting: Orthopedic Surgery

## 2020-04-06 ENCOUNTER — Encounter (HOSPITAL_BASED_OUTPATIENT_CLINIC_OR_DEPARTMENT_OTHER)
Admission: RE | Disposition: A | Discharge status: Home / Self Care | Payer: Self-pay | Source: Home / Self Care | Attending: Orthopedic Surgery

## 2020-04-06 DIAGNOSIS — Z7984 Long term (current) use of oral hypoglycemic drugs: Secondary | ICD-10-CM | POA: Insufficient documentation

## 2020-04-06 DIAGNOSIS — S82832A Other fracture of upper and lower end of left fibula, initial encounter for closed fracture: Secondary | ICD-10-CM | POA: Diagnosis present

## 2020-04-06 DIAGNOSIS — E1122 Type 2 diabetes mellitus with diabetic chronic kidney disease: Secondary | ICD-10-CM | POA: Diagnosis not present

## 2020-04-06 DIAGNOSIS — X58XXXA Exposure to other specified factors, initial encounter: Secondary | ICD-10-CM | POA: Insufficient documentation

## 2020-04-06 DIAGNOSIS — N189 Chronic kidney disease, unspecified: Secondary | ICD-10-CM | POA: Diagnosis not present

## 2020-04-06 DIAGNOSIS — Y939 Activity, unspecified: Secondary | ICD-10-CM | POA: Diagnosis not present

## 2020-04-06 DIAGNOSIS — S8262XA Displaced fracture of lateral malleolus of left fibula, initial encounter for closed fracture: Secondary | ICD-10-CM | POA: Insufficient documentation

## 2020-04-06 DIAGNOSIS — E1165 Type 2 diabetes mellitus with hyperglycemia: Secondary | ICD-10-CM | POA: Insufficient documentation

## 2020-04-06 HISTORY — PX: ORIF ANKLE FRACTURE: SHX5408

## 2020-04-06 LAB — BASIC METABOLIC PANEL
Anion gap: 12 (ref 5–15)
BUN: 15 mg/dL (ref 6–20)
CO2: 20 mmol/L — ABNORMAL LOW (ref 22–32)
Calcium: 9.3 mg/dL (ref 8.9–10.3)
Chloride: 103 mmol/L (ref 98–111)
Creatinine, Ser: 0.83 mg/dL (ref 0.44–1.00)
GFR, Estimated: >60 mL/min (ref >60)
Glucose, Bld: 225 mg/dL — ABNORMAL HIGH (ref 70–99)
Potassium: 3.6 mmol/L (ref 3.5–5.1)
Sodium: 135 mmol/L (ref 135–145)

## 2020-04-06 LAB — GLUCOSE, CAPILLARY
Glucose-Capillary: 225 mg/dL — ABNORMAL HIGH (ref 70–99)
Glucose-Capillary: 210 mg/dL — ABNORMAL HIGH (ref 70–99)

## 2020-04-06 LAB — POCT PREGNANCY, URINE: Preg Test, Ur: NEGATIVE

## 2020-04-06 SURGERY — OPEN REDUCTION INTERNAL FIXATION (ORIF) ANKLE FRACTURE
Anesthesia: General | Site: Ankle | Laterality: Left

## 2020-04-06 MED ORDER — BUPIVACAINE-EPINEPHRINE (PF) 0.5% -1:200000 IJ SOLN
INTRAMUSCULAR | Status: DC | PRN
  Administered 2020-04-06: 30 mL via PERINEURAL

## 2020-04-06 MED ORDER — FENTANYL CITRATE (PF) 250 MCG/5ML IJ SOLN
INTRAMUSCULAR | Status: DC | PRN
  Administered 2020-04-06: 25 ug via INTRAVENOUS

## 2020-04-06 MED ORDER — ONDANSETRON HCL 4 MG/2ML IJ SOLN
INTRAMUSCULAR | Status: AC
  Filled 2020-04-06: qty 2

## 2020-04-06 MED ORDER — PROPOFOL 10 MG/ML IV BOLUS
INTRAVENOUS | Status: DC | PRN
  Administered 2020-04-06: 200 mg via INTRAVENOUS

## 2020-04-06 MED ORDER — ACETAMINOPHEN 500 MG PO TABS
ORAL_TABLET | ORAL | Status: AC
  Filled 2020-04-06: qty 2

## 2020-04-06 MED ORDER — BUPIVACAINE HCL (PF) 0.5 % IJ SOLN
INTRAMUSCULAR | Status: AC
  Filled 2020-04-06: qty 30

## 2020-04-06 MED ORDER — DEXAMETHASONE SODIUM PHOSPHATE 10 MG/ML IJ SOLN
INTRAMUSCULAR | Status: DC | PRN
  Administered 2020-04-06: 5 mg via INTRAVENOUS

## 2020-04-06 MED ORDER — CEFAZOLIN SODIUM-DEXTROSE 2-4 GM/100ML-% IV SOLN
INTRAVENOUS | Status: AC
  Filled 2020-04-06: qty 100

## 2020-04-06 MED ORDER — MIDAZOLAM HCL 2 MG/2ML IJ SOLN
INTRAMUSCULAR | Status: AC
  Filled 2020-04-06: qty 2

## 2020-04-06 MED ORDER — PROPOFOL 10 MG/ML IV BOLUS
INTRAVENOUS | Status: AC
  Filled 2020-04-06: qty 40

## 2020-04-06 MED ORDER — BUPIVACAINE-EPINEPHRINE (PF) 0.5% -1:200000 IJ SOLN: INTRAMUSCULAR | Status: DC | PRN

## 2020-04-06 MED ORDER — INSULIN ASPART 100 UNIT/ML ~~LOC~~ SOLN
4.0000 [IU] | Freq: Once | SUBCUTANEOUS | Status: AC
  Administered 2020-04-06: 4 [IU] via SUBCUTANEOUS

## 2020-04-06 MED ORDER — OXYCODONE HCL 5 MG/5ML PO SOLN: 5.0000 mg | Freq: Once | ORAL | Status: DC | PRN

## 2020-04-06 MED ORDER — FENTANYL CITRATE (PF) 100 MCG/2ML IJ SOLN
INTRAMUSCULAR | Status: AC
  Filled 2020-04-06: qty 2

## 2020-04-06 MED ORDER — ACETAMINOPHEN 500 MG PO TABS
1000.0000 mg | ORAL_TABLET | Freq: Once | ORAL | Status: AC
  Administered 2020-04-06: 1000 mg via ORAL

## 2020-04-06 MED ORDER — LACTATED RINGERS IV SOLN: INTRAVENOUS | Status: DC

## 2020-04-06 MED ORDER — MIDAZOLAM HCL 2 MG/2ML IJ SOLN
2.0000 mg | Freq: Once | INTRAMUSCULAR | Status: AC
  Administered 2020-04-06: 2 mg via INTRAVENOUS

## 2020-04-06 MED ORDER — OXYCODONE HCL 5 MG PO TABS: 5.0000 mg | ORAL_TABLET | ORAL | 0 refills | Status: DC | PRN

## 2020-04-06 MED ORDER — CEFAZOLIN SODIUM-DEXTROSE 2-4 GM/100ML-% IV SOLN
2.0000 g | INTRAVENOUS | Status: AC
  Administered 2020-04-06: 2 g via INTRAVENOUS

## 2020-04-06 MED ORDER — LIDOCAINE 2% (20 MG/ML) 5 ML SYRINGE
INTRAMUSCULAR | Status: DC | PRN
  Administered 2020-04-06: 80 mg via INTRAVENOUS

## 2020-04-06 MED ORDER — INSULIN ASPART 100 UNIT/ML ~~LOC~~ SOLN
SUBCUTANEOUS | Status: AC
  Filled 2020-04-06: qty 1

## 2020-04-06 MED ORDER — FENTANYL CITRATE (PF) 100 MCG/2ML IJ SOLN
100.0000 ug | Freq: Once | INTRAMUSCULAR | Status: AC
Start: 2020-04-06 — End: 2020-04-06
  Administered 2020-04-06: 100 ug via INTRAVENOUS

## 2020-04-06 MED ORDER — ONDANSETRON HCL 4 MG/2ML IJ SOLN
INTRAMUSCULAR | Status: DC | PRN
  Administered 2020-04-06: 4 mg via INTRAVENOUS

## 2020-04-06 MED ORDER — OXYCODONE HCL 5 MG PO TABS
5.0000 mg | ORAL_TABLET | Freq: Once | ORAL | Status: DC | PRN
Start: 2020-04-06 — End: 2020-04-06

## 2020-04-06 MED ORDER — ONDANSETRON HCL 4 MG/2ML IJ SOLN: 4.0000 mg | Freq: Once | INTRAMUSCULAR | Status: DC | PRN

## 2020-04-06 MED ORDER — LIDOCAINE 2% (20 MG/ML) 5 ML SYRINGE
INTRAMUSCULAR | Status: AC
  Filled 2020-04-06: qty 5

## 2020-04-06 MED ORDER — FENTANYL CITRATE (PF) 100 MCG/2ML IJ SOLN: 25.0000 ug | INTRAMUSCULAR | Status: DC | PRN

## 2020-04-06 MED ORDER — ROCURONIUM BROMIDE 10 MG/ML (PF) SYRINGE
PREFILLED_SYRINGE | INTRAVENOUS | Status: AC
  Filled 2020-04-06: qty 10

## 2020-04-06 MED ORDER — DEXAMETHASONE SODIUM PHOSPHATE 10 MG/ML IJ SOLN
INTRAMUSCULAR | Status: AC
  Filled 2020-04-06: qty 1

## 2020-04-06 SURGICAL SUPPLY — 80 items
BANDAGE ESMARK 6X9 LF (GAUZE/BANDAGES/DRESSINGS) ×1 IMPLANT
BIT DRILL 110X2.5XQCK CNCT (BIT) IMPLANT
BIT DRILL 2.5 (BIT) ×2
BIT DRL 110X2.5XQCK CNCT (BIT) ×1
BLADE SURG 15 STRL LF DISP TIS (BLADE) ×3 IMPLANT
BLADE SURG 15 STRL SS (BLADE) ×6
BNDG CMPR 9X6 STRL LF SNTH (GAUZE/BANDAGES/DRESSINGS) ×1
BNDG COHESIVE 4X5 TAN STRL (GAUZE/BANDAGES/DRESSINGS) ×2 IMPLANT
BNDG ELASTIC 4X5.8 VLCR STR LF (GAUZE/BANDAGES/DRESSINGS) ×2 IMPLANT
BNDG ELASTIC 6X5.8 VLCR STR LF (GAUZE/BANDAGES/DRESSINGS) ×2 IMPLANT
BNDG ESMARK 6X9 LF (GAUZE/BANDAGES/DRESSINGS) ×2
CANISTER SUCT 1200ML W/VALVE (MISCELLANEOUS) ×2 IMPLANT
CLSR STERI-STRIP ANTIMIC 1/2X4 (GAUZE/BANDAGES/DRESSINGS) IMPLANT
COVER BACK TABLE 60X90IN (DRAPES) ×2 IMPLANT
COVER WAND RF STERILE (DRAPES) IMPLANT
CUFF TOURN SGL QUICK 34 (TOURNIQUET CUFF)
CUFF TRNQT CYL 34X4.125X (TOURNIQUET CUFF) IMPLANT
DECANTER SPIKE VIAL GLASS SM (MISCELLANEOUS) IMPLANT
DRAPE C-ARM 42X72 X-RAY (DRAPES) IMPLANT
DRAPE C-ARMOR (DRAPES) IMPLANT
DRAPE EXTREMITY T 121X128X90 (DISPOSABLE) ×2 IMPLANT
DRAPE IMP U-DRAPE 54X76 (DRAPES) ×2 IMPLANT
DRAPE INCISE IOBAN 66X45 STRL (DRAPES) ×2 IMPLANT
DRAPE OEC MINIVIEW 54X84 (DRAPES) IMPLANT
DRAPE U-SHAPE 47X51 STRL (DRAPES) ×2 IMPLANT
DRSG ADAPTIC 3X8 NADH LF (GAUZE/BANDAGES/DRESSINGS) IMPLANT
DRSG PAD ABDOMINAL 8X10 ST (GAUZE/BANDAGES/DRESSINGS) ×4 IMPLANT
DURAPREP 26ML APPLICATOR (WOUND CARE) ×2 IMPLANT
ELECT REM PT RETURN 9FT ADLT (ELECTROSURGICAL) ×2
ELECTRODE REM PT RTRN 9FT ADLT (ELECTROSURGICAL) ×1 IMPLANT
GAUZE SPONGE 4X4 12PLY STRL (GAUZE/BANDAGES/DRESSINGS) ×2 IMPLANT
GLOVE ORTHO TXT STRL SZ7.5 (GLOVE) ×2 IMPLANT
GLOVE SRG 8 PF TXTR STRL LF DI (GLOVE) ×1 IMPLANT
GLOVE SURG ENC MOIS LTX SZ7 (GLOVE) ×4 IMPLANT
GLOVE SURG UNDER POLY LF SZ7 (GLOVE) ×4 IMPLANT
GLOVE SURG UNDER POLY LF SZ8 (GLOVE) ×2
GOWN STRL REUS W/ TWL LRG LVL3 (GOWN DISPOSABLE) ×1 IMPLANT
GOWN STRL REUS W/ TWL XL LVL3 (GOWN DISPOSABLE) ×2 IMPLANT
GOWN STRL REUS W/TWL LRG LVL3 (GOWN DISPOSABLE) ×2
GOWN STRL REUS W/TWL XL LVL3 (GOWN DISPOSABLE) ×4
NDL HYPO 25X1 1.5 SAFETY (NEEDLE) IMPLANT
NEEDLE HYPO 25X1 1.5 SAFETY (NEEDLE) IMPLANT
NS IRRIG 1000ML POUR BTL (IV SOLUTION) ×2 IMPLANT
PACK BASIN DAY SURGERY FS (CUSTOM PROCEDURE TRAY) ×2 IMPLANT
PAD CAST 4YDX4 CTTN HI CHSV (CAST SUPPLIES) ×2 IMPLANT
PADDING CAST COTTON 4X4 STRL (CAST SUPPLIES) ×4
PENCIL SMOKE EVACUATOR (MISCELLANEOUS) ×2 IMPLANT
PLATE 6HOLE 1/3 TUBULAR (Plate) ×1 IMPLANT
SCREW CANC 2.5XFT 16X4XST SM (Screw) IMPLANT
SCREW CANC 4.0X16 (Screw) ×2 IMPLANT
SCREW CANCELLOUS 4.0X18 (Screw) ×3 IMPLANT
SCREW CORT 2.5X20X3.5XST SM (Screw) IMPLANT
SCREW CORTICAL 3.5X12 (Screw) ×1 IMPLANT
SCREW CORTICAL 3.5X14 (Screw) ×3 IMPLANT
SCREW CORTICAL 3.5X20 (Screw) ×2 IMPLANT
SHEET MEDIUM DRAPE 40X70 STRL (DRAPES) IMPLANT
SLEEVE SCD COMPRESS KNEE MED (MISCELLANEOUS) ×2 IMPLANT
SPLINT FAST PLASTER 5X30 (CAST SUPPLIES) ×20
SPLINT PLASTER CAST FAST 5X30 (CAST SUPPLIES) IMPLANT
SPONGE LAP 4X18 RFD (DISPOSABLE) ×2 IMPLANT
STAPLER VISISTAT 35W (STAPLE) IMPLANT
SUCTION FRAZIER HANDLE 10FR (MISCELLANEOUS) ×2
SUCTION TUBE FRAZIER 10FR DISP (MISCELLANEOUS) ×1 IMPLANT
SUT ETHILON 3 0 PS 1 (SUTURE) IMPLANT
SUT ETHILON 4 0 PS 2 18 (SUTURE) IMPLANT
SUT MNCRL AB 4-0 PS2 18 (SUTURE) IMPLANT
SUT VIC AB 0 CT1 27 (SUTURE)
SUT VIC AB 0 CT1 27XBRD ANBCTR (SUTURE) IMPLANT
SUT VIC AB 2-0 SH 18 (SUTURE) ×1 IMPLANT
SUT VIC AB 2-0 SH 27 (SUTURE) ×2
SUT VIC AB 2-0 SH 27XBRD (SUTURE) IMPLANT
SUT VIC AB 3-0 SH 27 (SUTURE) ×2
SUT VIC AB 3-0 SH 27X BRD (SUTURE) IMPLANT
SUT VICRYL 3-0 CR8 SH (SUTURE) IMPLANT
SYR BULB EAR ULCER 3OZ GRN STR (SYRINGE) ×2 IMPLANT
SYR CONTROL 10ML LL (SYRINGE) IMPLANT
TOWEL GREEN STERILE FF (TOWEL DISPOSABLE) ×2 IMPLANT
TUBE CONNECTING 20X1/4 (TUBING) ×2 IMPLANT
UNDERPAD 30X36 HEAVY ABSORB (UNDERPADS AND DIAPERS) ×2 IMPLANT
YANKAUER SUCT BULB TIP NO VENT (SUCTIONS) ×2 IMPLANT

## 2020-04-06 NOTE — Discharge Instructions (Signed)
Diet: As you were doing prior to hospitalization   Shower:  May shower but keep the wounds dry, use an occlusive plastic wrap, NO SOAKING IN TUB.  If the bandage gets wet, change with a clean dry gauze.  If you have a splint on, leave the splint in place and keep the splint dry with a plastic bag.  Dressing:  You may change your dressing 3-5 days after surgery, unless you have a splint.  If you have a splint, then just leave the splint in place and we will change your bandages during your first follow-up appointment.    If you had hand or foot surgery, we will plan to remove your stitches in about 2 weeks in the office.  For all other surgeries, there are sticky tapes (steri-strips) on your wounds and all the stitches are absorbable.  Leave the steri-strips in place when changing your dressings, they will peel off with time, usually 2-3 weeks.  Activity:  Increase activity slowly as tolerated, but follow the weight bearing instructions below.  The rules on driving is that you can not be taking narcotics while you drive, and you must feel in control of the vehicle.    Weight Bearing:   No bearing weight on left foot.    To prevent constipation: you may use a stool softener such as -  Colace (over the counter) 100 mg by mouth twice a day  Drink plenty of fluids (prune juice may be helpful) and high fiber foods Miralax (over the counter) for constipation as needed.    Itching:  If you experience itching with your medications, try taking only a single pain pill, or even half a pain pill at a time.  You may take up to 10 pain pills per day, and you can also use benadryl over the counter for itching or also to help with sleep.   Precautions:  If you experience chest pain or shortness of breath - call 911 immediately for transfer to the hospital emergency department!!  If you develop a fever greater that 101 F, purulent drainage from wound, increased redness or drainage from wound, or calf pain --  Call the office at [phone]                                                Follow- Up Appointment:  Please call for an appointment to be seen in 2 weeks [place] - [phone]    NO TYLENOL PRODUCTS UNTIL 4:15 PM   Post Anesthesia Home Care Instructions  Activity: Get plenty of rest for the remainder of the day. A responsible individual must stay with you for 24 hours following the procedure.  For the next 24 hours, DO NOT: -Drive a car -Advertising copywriter -Drink alcoholic beverages -Take any medication unless instructed by your physician -Make any legal decisions or sign important papers.  Meals: Start with liquid foods such as gelatin or soup. Progress to regular foods as tolerated. Avoid greasy, spicy, heavy foods. If nausea and/or vomiting occur, drink only clear liquids until the nausea and/or vomiting subsides. Call your physician if vomiting continues.  Special Instructions/Symptoms: Your throat may feel dry or sore from the anesthesia or the breathing tube placed in your throat during surgery. If this causes discomfort, gargle with warm salt water. The discomfort should disappear within 24 hours.  If you had  a scopolamine patch placed behind your ear for the management of post- operative nausea and/or vomiting:  1. The medication in the patch is effective for 72 hours, after which it should be removed.  Wrap patch in a tissue and discard in the trash. Wash hands thoroughly with soap and water. 2. You may remove the patch earlier than 72 hours if you experience unpleasant side effects which may include dry mouth, dizziness or visual disturbances. 3. Avoid touching the patch. Wash your hands with soap and water after contact with the patch.     Regional Anesthesia Blocks  1. Numbness or the inability to move the "blocked" extremity may last from 3-48 hours after placement. The length of time depends on the medication injected and your individual response to the  medication. If the numbness is not going away after 48 hours, call your surgeon.  2. The extremity that is blocked will need to be protected until the numbness is gone and the  Strength has returned. Because you cannot feel it, you will need to take extra care to avoid injury. Because it may be weak, you may have difficulty moving it or using it. You may not know what position it is in without looking at it while the block is in effect.  3. For blocks in the legs and feet, returning to weight bearing and walking needs to be done carefully. You will need to wait until the numbness is entirely gone and the strength has returned. You should be able to move your leg and foot normally before you try and bear weight or walk. You will need someone to be with you when you first try to ensure you do not fall and possibly risk injury.  4. Bruising and tenderness at the needle site are common side effects and will resolve in a few days.  5. Persistent numbness or new problems with movement should be communicated to the surgeon or the [facility] [phone] [facility] [phone]).

## 2020-04-06 NOTE — Progress Notes (Signed)
Assisted Dr. Foster with left, ultrasound guided, popliteal block. Side rails up, monitors on throughout procedure. See vital signs in flow sheet. Tolerated Procedure well. 

## 2020-04-06 NOTE — Anesthesia Procedure Notes (Signed)
Anesthesia Regional Block: Popliteal block   Pre-Anesthetic Checklist: ,, timeout performed, Correct Patient, Correct Site, Correct Laterality, Correct Procedure, Correct Position, site marked, Risks and benefits discussed,  Surgical consent,  Pre-op evaluation,  At surgeon's request and post-op pain management  Laterality: Left  Prep: chloraprep       Needles:  Injection technique: Single-shot  Needle Type: Echogenic Stimulator Needle     Needle Length: 9cm  Needle Gauge: 21   Needle insertion depth: 7 cm   Additional Needles:   Procedures:,,,, ultrasound used (permanent image in chart),,,,  Narrative:  Start time: 04/06/2020 10:55 AM End time: 04/06/2020 11:00 AM Injection made incrementally with aspirations every 5 mL.  Performed by: Personally  Anesthesiologist: Mal Amabile, MD  Additional Notes: Timeout performed. Patient sedated. Relevant anatomy ID'd using [place]. Incremental 2-74ml injection of LA with frequent aspiration. Patient tolerated procedure well.        Left Popliteal Block

## 2020-04-06 NOTE — H&P (Signed)
PREOPERATIVE H&P  Chief Complaint: Left ankle pain  HPI: Caitlin Mejia is a 35 y.o. female who presents for preoperative history and physical with a diagnosis of left distal fibula fracture. Symptoms are rated as moderate to severe, and have been worsening.  This is significantly impairing activities of daily living.  She has elected for surgical management.  Her preoperative x-rays look reasonably good on the AP and the mortise view, although the lateral view looks like there was definitely some shortening of the fracture, with some rotational malalignment.  Past Medical History:  Diagnosis Date  . Chronic kidney disease   . Diabetes mellitus without complication ([place])   . Gall stones    Past Surgical History:  Procedure Laterality Date  . URETERAL STENT PLACEMENT    . wisdom teeth     Social History   Socioeconomic History  . Marital status: Single    Spouse name: Not on file  . Number of children: Not on file  . Years of education: Not on file  . Highest education level: Not on file  Occupational History  . Not on file  Tobacco Use  . Smoking status: Never Smoker  . Smokeless tobacco: Never Used  Substance and Sexual Activity  . Alcohol use: Yes    Comment: Rarely  . Drug use: No  . Sexual activity: Yes    Birth control/protection: None  Other Topics Concern  . Not on file  Social History Narrative  . Not on file   Social Determinants of Health   Financial Resource Strain: Not on file  Food Insecurity: Not on file  Transportation Needs: Not on file  Physical Activity: Not on file  Stress: Not on file  Social Connections: Not on file   Family History  Problem Relation Age of Onset  . Cerebral aneurysm Father   . Diabetes Paternal Uncle    No Known Allergies Prior to Admission medications   Medication Sig Start Date End Date Taking? Authorizing Provider  Blood Glucose Monitoring Suppl (TRUE METRIX METER) w/Device KIT Use as instructed to check blood  sugar once daily. 04/02/20   Swords, Darrick Penna, MD  glucose blood (TRUE METRIX BLOOD GLUCOSE TEST) test strip Use as instructed to check blood sugar once daily. 04/02/20   Swords, Darrick Penna, MD  metFORMIN (GLUCOPHAGE XR) 500 MG 24 hr tablet Take 1 tablet (500 mg total) by mouth daily with breakfast. 04/02/20   Swords, Darrick Penna, MD  oxyCODONE-acetaminophen (PERCOCET/ROXICET) 5-325 MG tablet Take 2 tablets by mouth every 8 (eight) hours as needed for severe pain.    [provider]  TRUEplus Lancets 28G MISC Use as instructed to check blood sugar once daily. 04/02/20   Swords, Darrick Penna, MD     Positive ROS: All other systems have been reviewed and were otherwise negative with the exception of those mentioned in the HPI and as above.  Physical Exam: General: Alert, no acute distress Cardiovascular: No pedal edema Respiratory: No cyanosis, no use of accessory musculature GI: No organomegaly, abdomen is soft and non-tender Skin: No lesions in the area of chief complaint Neurologic: Sensation intact distally Psychiatric: Patient is competent for consent with normal mood and affect Lymphatic: No axillary or cervical lymphadenopathy  MUSCULOSKELETAL: Left ankle has positive pain to palpation laterally, not as much medially, sensation intact distally.  Assessment: Left distal fibula fracture with some shortening and displacement and a 35 year old woman with poorly controlled diabetes, he hemoglobin A1c apparently greater than 12, at high  risk for posttraumatic arthrosis as well as surgical complication.   Plan: I had a long discussion with her regarding the risks benefits and alternatives to surgical intervention.  We specifically discussed the options for nonsurgical management, although I do have concern that given her young age nonsurgical management of her distal fibula fracture could potentially lead to posttraumatic arthrosis with shortening of the fibula and unsatisfactory ultimate outcome,  it looks like it is at least 2 mm short if not 3 mm short on her plain films as seen on the lateral view.  Having said that her extremely poor blood glucose control level makes her very high risk for ankle surgery, she is going to try and optimize her blood sugar control around the time of surgery, her medical doctors are adjusting her medications, and we will plan to proceed.  Plan for Procedure(s): OPEN REDUCTION INTERNAL FIXATION (ORIF) DISTAL FIBULAR  FRACTURE  The risks benefits and alternatives were discussed with the patient including but not limited to the risks of nonoperative treatment, versus surgical intervention including infection, bleeding, nerve injury, malunion, nonunion, the need for revision surgery, hardware prominence, hardware failure, the need for hardware removal, blood clots, cardiopulmonary complications, morbidity, mortality, among others, and they were willing to proceed.      Johnny Bridge, MD Cell [phone]   04/06/2020 9:55 AM

## 2020-04-06 NOTE — Anesthesia Preprocedure Evaluation (Signed)
Anesthesia Evaluation  Patient identified by MRN, date of birth, ID band Patient awake    Reviewed: Allergy & Precautions, NPO status , Patient's Chart, lab work & pertinent test results  Airway Mallampati: II  TM Distance: >3 FB Neck ROM: Full    Dental no notable dental hx. (+) Teeth Intact   Pulmonary neg pulmonary ROS,    Pulmonary exam normal breath sounds clear to auscultation       Cardiovascular negative cardio ROS Normal cardiovascular exam Rhythm:Regular Rate:Normal     Neuro/Psych negative neurological ROS  negative psych ROS   GI/Hepatic negative GI ROS, Neg liver ROS,   Endo/Other  diabetes, Poorly Controlled, Type 2, Oral Hypoglycemic AgentsObesity  Renal/GU Renal diseaseHx/o renal calculi  negative genitourinary   Musculoskeletal Fx left distal fibula   Abdominal (+) + obese,   Peds  Hematology negative hematology ROS (+)   Anesthesia Other Findings   Reproductive/Obstetrics                             Anesthesia Physical Anesthesia Plan  ASA: II  Anesthesia Plan: General   Post-op Pain Management:  Regional for Post-op pain   Induction: Intravenous  PONV Risk Score and Plan: 4 or greater and Midazolam, Treatment may vary due to age or medical condition and Ondansetron  Airway Management Planned: LMA  Additional Equipment:   Intra-op Plan:   Post-operative Plan: Extubation in OR  Informed Consent: I have reviewed the patients History and Physical, chart, labs and discussed the procedure including the risks, benefits and alternatives for the proposed anesthesia with the patient or authorized representative who has indicated his/her understanding and acceptance.     Dental advisory given  Plan Discussed with: CRNA and Anesthesiologist  Anesthesia Plan Comments:         Anesthesia Quick Evaluation

## 2020-04-06 NOTE — Op Note (Signed)
04/06/2020  PATIENT:  [name]    PRE-OPERATIVE DIAGNOSIS:  Left distal fibula fracture with shortening and displacement  POST-OPERATIVE DIAGNOSIS:  Same  PROCEDURE:    1.  Open Reduction Internal Fixation Fibula 2.  3 views plus a stress view of the left ankle  SURGEON:  Eulas Post, MD  PHYSICIAN ASSISTANT: Janine Ores, PA-C, present and scrubbed throughout the case, critical for completion in a timely fashion, and for retraction, instrumentation, and closure.  ANESTHESIA:   General with regional block  ESTIMATED BLOOD LOSS: 25 mL  UNIQUE ASPECTS OF THE CASE: The fracture had already started to heal, and was somewhat shortened.  I was able to reduce this anatomically.  PREOPERATIVE INDICATIONS:  Caitlin Mejia is a  35 y.o. female with a diagnosis of LEFT ANKLE DISPLACED FRACTURE OF LATERAL MALLEOLUS OF FIBULA who elected for surgical management to minimize the risk for malunion and nonunion and post-traumatic arthritis.  She had significant diabetes is fairly poorly controlled although she says that she has been under better control in the last couple of weeks with blood sugars under 200, but A1c 1 month ago was 12 approximately.  The risks benefits and alternatives were discussed with the patient preoperatively including but not limited to the risks of infection, bleeding, nerve injury, cardiopulmonary complications, the need for revision surgery, the need for hardware removal, among others, and the patient was willing to proceed.  OPERATIVE IMPLANTS: Biomet / Zimmer 1/3 tubular plate, with a single interfragmentary screw.  I had 3 distal cancellous screws and 3 proximal cortical screws.  OPERATIVE PROCEDURE: The patient was brought to the operating room and placed in the supine position. All bony prominences were padded. General anesthesia was administered. The lower extremity was prepped and draped in the usual sterile fashion. The leg was elevated and  exsanguinated and the tourniquet was inflated. Time out was performed.   Incision was made over the distal fibula and the fracture was exposed and reduced anatomically with a clamp. A position screw was placed, because I had anatomic compression with clamp fixation. I then applied a 1/3 tubular plate and secured it proximally and distally non-locking screws. Bone quality was reasonably good. I used c-arm to confirm satisfactory reduction and fixation.   The syndesmosis was stressed using live fluoroscopy and found to be stable.   The wounds were irrigated, and closed with vicryl with routine closure for the skin. The wounds were injected with local anesthetic. Sterile gauze was applied followed by a posterior splint. She was awakened and returned to the PACU in stable and satisfactory condition. There were no complications.  3-Views plus a stress view of the ankle demonstrated appropriate reconstruction of the mortise with internal fixation and appropriate stability of the syndesmosis.

## 2020-04-06 NOTE — Anesthesia Procedure Notes (Signed)
Procedure Name: LMA Insertion Date/Time: 04/06/2020 11:14 AM Performed by: Elliot Dally, CRNA Pre-anesthesia Checklist: Patient identified, Emergency Drugs available, Suction available and Patient being monitored Patient Re-evaluated:Patient Re-evaluated prior to induction Oxygen Delivery Method: Circle System Utilized Preoxygenation: Pre-oxygenation with 100% oxygen Induction Type: IV induction Ventilation: Mask ventilation without difficulty LMA: LMA inserted LMA Size: 4.0 Number of attempts: 1 Airway Equipment and Method: Bite block Placement Confirmation: positive ETCO2 Tube secured with: Tape Dental Injury: Teeth and Oropharynx as per pre-operative assessment

## 2020-04-06 NOTE — Anesthesia Postprocedure Evaluation (Signed)
Anesthesia Post Note  Patient: Caitlin Mejia  Procedure(s) Performed: OPEN REDUCTION INTERNAL FIXATION (ORIF) DISTAL FIBULAR  FRACTURE (Left Ankle)     Patient location during evaluation: PACU Anesthesia Type: General Level of consciousness: awake and alert and oriented Pain management: pain level controlled Vital Signs Assessment: post-procedure vital signs reviewed and stable Respiratory status: spontaneous breathing, nonlabored ventilation and respiratory function stable Cardiovascular status: blood pressure returned to baseline and stable Postop Assessment: no apparent nausea or vomiting Anesthetic complications: no   No complications documented.  Last Vitals:  Vitals:   04/06/20 1300 04/06/20 1315  BP: 136/85   Pulse: 63 64  Resp: (!) 24 17  Temp:    SpO2: 100% 100%    Last Pain:  Vitals:   04/06/20 1315  TempSrc:   PainSc: Asleep                 Mishell Donalson A.

## 2020-04-06 NOTE — Transfer of Care (Signed)
Immediate Anesthesia Transfer of Care Note  Patient: Caitlin Mejia  Procedure(s) Performed: OPEN REDUCTION INTERNAL FIXATION (ORIF) DISTAL FIBULAR  FRACTURE (Left Ankle)  Patient Location: PACU  Anesthesia Type:GA combined with regional for post-op pain  Level of Consciousness: drowsy  Airway & Oxygen Therapy: Patient Spontanous Breathing and Patient connected to nasal cannula oxygen  Post-op Assessment: Report given to RN and Post -op Vital signs reviewed and stable  Post vital signs: Reviewed and stable  Last Vitals:  Vitals Value Taken Time  BP 127/74 04/06/20 1245  Temp    Pulse 82 04/06/20 1248  Resp 18 04/06/20 1248  SpO2 98 % 04/06/20 1248  Vitals shown include unvalidated device data.  Last Pain:  Vitals:   04/06/20 1010  TempSrc: Oral  PainSc: 0-No pain      Patients Stated Pain Goal: 4 (04/06/20 1010)  Complications: No complications documented.

## 2020-04-07 ENCOUNTER — Encounter (HOSPITAL_BASED_OUTPATIENT_CLINIC_OR_DEPARTMENT_OTHER): Payer: Self-pay | Admitting: Orthopedic Surgery

## 2020-04-07 NOTE — Addendum Note (Signed)
Addendum  created 04/07/20 1215 by Aidric Endicott, Jewel Baize, CRNA   Charge Capture section accepted

## 2020-04-08 LAB — C-PEPTIDE: C-Peptide: 6.4 ng/mL — ABNORMAL HIGH (ref 1.1–4.4)

## 2020-04-08 LAB — GLUTAMIC ACID DECARBOXYLASE AUTO ABS: Glutamic Acid Decarb Ab: <5 U/mL (ref 0.0–5.0)

## 2020-11-23 ENCOUNTER — Encounter: Payer: Self-pay | Admitting: Nurse Practitioner

## 2020-11-23 ENCOUNTER — Other Ambulatory Visit: Payer: Self-pay

## 2020-11-23 ENCOUNTER — Telehealth (INDEPENDENT_AMBULATORY_CARE_PROVIDER_SITE_OTHER): Payer: Medicaid Other | Admitting: Nurse Practitioner

## 2020-11-23 DIAGNOSIS — E1165 Type 2 diabetes mellitus with hyperglycemia: Secondary | ICD-10-CM

## 2020-11-23 MED ORDER — TRUE METRIX METER W/DEVICE KIT: PACK | 0 refills | Status: DC

## 2020-11-23 MED ORDER — TRUEPLUS LANCETS 28G MISC: 2 refills | Status: DC

## 2020-11-23 NOTE — Progress Notes (Signed)
Virtual Visit via Telephone Note  I connected with [name] on 11/23/20 at  3:30 PM EDT by telephone and verified that I am speaking with the correct person using two identifiers.  Location: Patient: home Provider: office   I discussed the limitations, risks, security and privacy concerns of performing an evaluation and management service by telephone and the availability of in person appointments. I also discussed with the patient that there may be a patient responsible charge related to this service. The patient expressed understanding and agreed to proceed.   History of Present Illness:  Patient presents today for medication refill.  Patient states that she was seen in January by provider and was diagnosed with diabetes.  She was prescribed metformin but did not take the medication.  She did not follow-up with the doctor at that time.  She realizes now that she does need to take her diabetes seriously and would like to get set up with a provider and establish care at our office.  We discussed that I can reorder her test strips and lancets.  Patient states that she does have a glucometer.  We discussed that she does need to keep a blood sugar log until her upcoming appointment.  We will schedule her soon as possible to establish care and provide diabetic management.  Patient is currently unsure of how her blood sugars have been running. Denies f/c/s, n/v/d, hemoptysis, PND, chest pain or edema.     Observations/Objective:  Vitals with BMI 04/06/2020 04/06/2020 04/06/2020  Height - - -  Weight - - -  BMI - - -  Systolic 130 - 135  Diastolic 87 - 81  Pulse 73 67 64      Assessment and Plan:  Diabetes:  Will reorder testing supplies - please keep log for upcoming visit  Will set up appointment to establish care  Will need repeat blood work  Will most likely need to be restarted on diabetic medications   Follow up:  Follow up as soon as possible to establish care  with new provider    I discussed the assessment and treatment plan with the patient. The patient was provided an opportunity to ask questions and all were answered. The patient agreed with the plan and demonstrated an understanding of the instructions.   The patient was advised to call back or seek an in-person evaluation if the symptoms worsen or if the condition fails to improve as anticipated.  I provided 23 minutes of non-face-to-face time during this encounter.   Ivonne Andrew, NP

## 2020-11-23 NOTE — Patient Instructions (Signed)
Diabetes:  Will reorder testing supplies - please keep log for upcoming visit  Will set up appointment to establish care  Will need repeat blood work  Will most likely need to be restarted on diabetic medications   Follow up:  Follow up as soon as possible to establish care with new provider

## 2020-12-14 ENCOUNTER — Ambulatory Visit: Payer: Medicaid Other | Admitting: Family Medicine

## 2021-01-25 ENCOUNTER — Ambulatory Visit (INDEPENDENT_AMBULATORY_CARE_PROVIDER_SITE_OTHER): Payer: Medicaid Other | Admitting: Family Medicine

## 2021-01-25 ENCOUNTER — Other Ambulatory Visit: Payer: Self-pay

## 2021-01-25 ENCOUNTER — Encounter: Payer: Self-pay | Admitting: Family Medicine

## 2021-01-25 VITALS — BP 111/76 | HR 67 | Temp 97.6°F | Resp 16 | Ht 62.0 in | Wt 223.4 lb

## 2021-01-25 DIAGNOSIS — E1165 Type 2 diabetes mellitus with hyperglycemia: Secondary | ICD-10-CM | POA: Diagnosis not present

## 2021-01-25 DIAGNOSIS — Z6841 Body Mass Index (BMI) 40.0 and over, adult: Secondary | ICD-10-CM | POA: Diagnosis not present

## 2021-01-25 LAB — POCT GLYCOSYLATED HEMOGLOBIN (HGB A1C): Hemoglobin A1C: 10.6 % — AB (ref 4.0–5.6)

## 2021-01-25 MED ORDER — METFORMIN HCL ER 500 MG PO TB24: 500.0000 mg | ORAL_TABLET | Freq: Two times a day (BID) | ORAL | 0 refills | Status: DC

## 2021-01-25 MED ORDER — GLIPIZIDE 10 MG PO TABS: 10.0000 mg | ORAL_TABLET | Freq: Two times a day (BID) | ORAL | 0 refills | Status: DC

## 2021-01-25 NOTE — Progress Notes (Signed)
Patient is here for follow-up DM 

## 2021-01-25 NOTE — Progress Notes (Signed)
Established Patient Office Visit  Subjective:  Patient ID: Caitlin Mejia, female    DOB: [birth year]  Age: 35 y.o. MRN: [id number]  CC:  Chief Complaint  Patient presents with   Diabetes    HPI Caitlin Mejia presents for follow up of diabetes. She denies acute complaints or concerns.   Past Medical History:  Diagnosis Date   Diabetes mellitus without complication ([place])    Gall stones     Past Surgical History:  Procedure Laterality Date   ORIF ANKLE FRACTURE Left 04/06/2020   Procedure: OPEN REDUCTION INTERNAL FIXATION (ORIF) DISTAL FIBULAR  FRACTURE;  Surgeon: Marchia Bond, MD;  Location: [place];  Service: Orthopedics;  Laterality: Left;   URETERAL STENT PLACEMENT     wisdom teeth      Family History  Problem Relation Age of Onset   Cerebral aneurysm Father    Diabetes Paternal Uncle     Social History   Socioeconomic History   Marital status: Single    Spouse name: Not on file   Number of children: Not on file   Years of education: Not on file   Highest education level: Not on file  Occupational History   Not on file  Tobacco Use   Smoking status: Never   Smokeless tobacco: Never  Substance and Sexual Activity   Alcohol use: Yes    Comment: Rarely   Drug use: No   Sexual activity: Yes    Birth control/protection: None  Other Topics Concern   Not on file  Social History Narrative   Not on file   Social Determinants of Health   Financial Resource Strain: Not on file  Food Insecurity: Not on file  Transportation Needs: Not on file  Physical Activity: Not on file  Stress: Not on file  Social Connections: Not on file  Intimate Partner Violence: Not on file    ROS Review of Systems  All other systems reviewed and are negative.  Objective:   Today's Vitals: BP 111/76   Pulse 67   Temp 97.6 F (36.4 C) (Temporal)   Resp 16   Ht _0  (1.575 m)   Wt 223 lb 6.4 oz (101.3 kg)   SpO2 97%   BMI 40.86 kg/m    Physical Exam Vitals and nursing note reviewed.  Constitutional:      General: She is not in acute distress. Cardiovascular:     Rate and Rhythm: Normal rate and regular rhythm.  Pulmonary:     Effort: Pulmonary effort is normal.     Breath sounds: Normal breath sounds.  Abdominal:     Palpations: Abdomen is soft.     Tenderness: There is no abdominal tenderness.  Musculoskeletal:     Right lower leg: No edema.     Left lower leg: No edema.  Neurological:     General: No focal deficit present.     Mental Status: She is alert and oriented to person, place, and time.    Assessment & Plan:   1. Uncontrolled type 2 diabetes mellitus with hyperglycemia (HCC) Elevated A1c. Referral to diabetic ed for further eval/mgt. Metoformin XR increased to 546m bid and glipizide prescribed 10 mg bid. [facility].  - POCT glycosylated hemoglobin (Hb A1C) - Ambulatory referral to diabetic education - Basic Metabolic Panel - AST - ALT - Lipid Panel  2. Class 3 severe obesity due to excess calories with serious comorbidity and body mass index (BMI)  of 40.0 to 44.9 in adult [facility]) Discussed dietary and activity options. Diabetic ed as above    Outpatient Encounter Medications as of 01/25/2021  Medication Sig   Blood Glucose Monitoring Suppl (TRUE METRIX METER) w/Device KIT Use as instructed to check blood sugar once daily.   glipiZIDE (GLUCOTROL) 10 MG tablet Take 1 tablet (10 mg total) by mouth 2 (two) times daily before a meal.   glucose blood test strip USE AS INSTRUCTED TO CHECK BLOOD SUGAR ONCE DAILY.   metFORMIN (GLUCOPHAGE-XR) 500 MG 24 hr tablet Take 1 tablet (500 mg total) by mouth 2 (two) times daily with a meal.   oxyCODONE (ROXICODONE) 5 MG immediate release tablet Take 1 tablet (5 mg total) by mouth every 4 (four) hours as needed for severe pain.   TRUEplus Lancets 28G MISC USE AS INSTRUCTED TO CHECK BLOOD SUGAR ONCE DAILY.   [DISCONTINUED]  metFORMIN (GLUCOPHAGE-XR) 500 MG 24 hr tablet TAKE 1 TABLET (500 MG TOTAL) BY MOUTH DAILY WITH BREAKFAST.   No facility-administered encounter medications on file as of 01/25/2021.    Follow-up: Return in about 3 months (around 04/27/2021) for follow up.   Becky Sax, MD

## 2021-01-30 NOTE — Progress Notes (Unsigned)
    S:     Patient arrives ***.  Presents for diabetes evaluation, education, and management Patient was referred and last seen by Primary Care Provider Dr. Andrey Campanile on 01/25/2021 at which time metformin was increased and glipizide was added.  Patient reports Diabetes was diagnosed in ***.   Family/Social History:  -Never smoker  Insurance coverage/medication affordability: ***  Medication adherence reported *** .   Current diabetes medications include:  -Metformin 500 mg BID -Glipizide 10 mg BID  Current hypertension medications include:  -None  Current hyperlipidemia medications include:  -None  Patient {Actions; denies-reports:120008} hypoglycemic events.  Patient reported dietary habits: Eats *** meals/day Breakfast:*** Lunch:*** Dinner:*** Snacks:*** Drinks:***  Patient-reported exercise habits: ***   Patient {Actions; denies-reports:120008} nocturia (nighttime urination).  Patient {Actions; denies-reports:120008} neuropathy (nerve pain). Patient {Actions; denies-reports:120008} visual changes. Patient {Actions; denies-reports:120008} self foot exams.     O:  Physical Exam  ROS  Lab Results  Component Value Date   HGBA1C 10.6 (A) 01/25/2021   There were no vitals filed for this visit.  Lipid Panel  No results found for: CHOL, TRIG, HDL, CHOLHDL, VLDL, LDLCALC, LDLDIRECT  Home fasting blood sugars: ***  2 hour post-meal/random blood sugars: ***.  Clinical Atherosclerotic Cardiovascular Disease (ASCVD): {YES/NO:21197} The ASCVD Risk score (Arnett DK, et al., 2019) failed to calculate for the following reasons:   The 2019 ASCVD risk score is only valid for ages 68 to 90   A/P: Diabetes longstanding*** currently ***. Patient is *** able to verbalize appropriate hypoglycemia management plan. Medication adherence appears ***. Control is suboptimal due to ***. -{Meds adjust:18428} basal insulin *** (insulin ***). Patient will continue to titrate 1 unit  every *** days if fasting blood sugar > 100mg /dl until fasting blood sugars reach goal or next visit.  -{Meds adjust:18428}  rapid insulin *** (insulin ***) to ***.  -{Meds adjust:18428} GLP-1 *** (generic name***) to ***.  -{Meds adjust:18428} SGLT2-I *** (generic name***) to ***. Counseled on sick day rules for ***. -Extensively discussed pathophysiology of diabetes, recommended lifestyle interventions, dietary effects on blood sugar control -Counseled on s/sx of and management of hypoglycemia -Next A1C anticipated ***.   ASCVD risk - primary***secondary prevention in patient with diabetes. Last LDL {Is/is not:9024} controlled. ASCVD risk score {Is/is not:9024} >20%  - {Desc; low/moderate/high:110033} intensity statin indicated. Aspirin {Is/is not:9024} indicated.  -{Meds adjust:18428} aspirin *** mg  -{Meds adjust:18428} ***statin *** mg.   Hypertension longstanding*** currently ***.  Blood pressure goal = *** mmHg. Medication adherence ***.  Blood pressure control is suboptimal due to ***. -***  Written patient instructions provided.  Total time in face to face counseling *** minutes.   Follow up Pharmacist/PCP*** Clinic Visit in ***.   Patient seen with ***

## 2021-01-31 ENCOUNTER — Ambulatory Visit: Payer: Medicaid Other | Admitting: Pharmacist

## 2021-02-01 ENCOUNTER — Ambulatory Visit: Payer: Medicaid Other | Attending: Family Medicine | Admitting: Pharmacist

## 2021-02-01 ENCOUNTER — Other Ambulatory Visit: Payer: Self-pay

## 2021-02-01 DIAGNOSIS — E1165 Type 2 diabetes mellitus with hyperglycemia: Secondary | ICD-10-CM | POA: Diagnosis not present

## 2021-02-01 MED ORDER — ACCU-CHEK GUIDE W/DEVICE KIT
PACK | 0 refills | Status: DC
  Filled 2021-02-01: qty 1, 30d supply, fill #0

## 2021-02-01 MED ORDER — ACCU-CHEK GUIDE VI STRP
ORAL_STRIP | 2 refills | Status: DC
  Filled 2021-02-01: qty 100, 90d supply, fill #0

## 2021-02-01 MED ORDER — ACCU-CHEK SOFTCLIX LANCETS MISC
2 refills | Status: DC
  Filled 2021-02-01: qty 100, 90d supply, fill #0

## 2021-02-01 MED ORDER — TRULICITY 0.75 MG/0.5ML ~~LOC~~ SOAJ: 0.7500 mg | SUBCUTANEOUS | 0 refills | Status: DC

## 2021-02-01 MED ORDER — ATORVASTATIN CALCIUM 40 MG PO TABS: 40.0000 mg | ORAL_TABLET | Freq: Every day | ORAL | 3 refills | Status: DC

## 2021-02-01 NOTE — Progress Notes (Signed)
    S:     Patient arrives in good spirits.  Presents for diabetes evaluation, education, and management. Patient was referred and last seen by Primary Care Provider on 01/25/2021 at which time metformin was increased and glipizide was added.  Patient reports Diabetes that is diagnosed within the last year  Family/Social History:  -Never smoker -Father: cerebral aneurysm  Insurance coverage/medication affordability:  Medicaid  Medication adherence reported since last visit for metformin, however did not take glipizide as she was unaware it treats diabetes.  Patient reported feelings of not wanting to accept new diabetes diagnosis, leading to lack of medication adherence. Patient now realizes the benefit medications can have on her glucose and longevity.    Current diabetes medications include:  -Metformin XR 500 mg BID  Current hypertension medications include: none Current hyperlipidemia medications include: none  Patient denies hypoglycemic events.  Patient reported dietary habits: Eats 3 meals/day Breakfast:nothing Lunch:burgers, occasional fries, sweets with every meal Dinner: Baked chicken, veggies, pizza, spaghetti Snacks:little debbies, chips, crackers Drinks:water  Patient reported a desire to eat and cook healthier. Patient will be referred to nutritionist for help in implementing dietary changes.  Patient-reported exercise habits: none   Patient reports nocturia (nighttime urination).  Patient reports neuropathy (nerve pain) about 1 months Patient reports visual changes. Patient denies self foot exams.     O:  Lab Results  Component Value Date   HGBA1C 10.6 (A) 01/25/2021   There were no vitals filed for this visit.  Lipid Panel  No results found for: CHOL, TRIG, HDL, CHOLHDL, VLDL, LDLCALC, LDLDIRECT  POCT today 179  Patient reported that her meter is broken and has not been checking blood glucose at home. Patient agrees to start checking blood  glucose with new meter and lancets.  Clinical Atherosclerotic Cardiovascular Disease (ASCVD): No  The ASCVD Risk score (Arnett DK, et al., 2019) failed to calculate for the following reasons:   The 2019 ASCVD risk score is only valid for ages 48 to 42   A/P: Diabetes recently diagnosed, currently not at goal. Patient is  able to verbalize appropriate hypoglycemia management plan. Medication adherence appears adequate since last appointment. Control is suboptimal due to diet. -Start Trulicity 0.75 mg every week -Continue metformin XR 500 mg twice daily -Cancel prescription for glipizide -Extensively discussed pathophysiology of diabetes, recommended lifestyle interventions, dietary effects on blood sugar control -Counseled on s/sx of and management of hypoglycemia -Next A1C anticipated 04/27/2020.   ASCVD risk - primary prevention in patient with diabetes.  ASCVD risk score is not >20%  - moderate intensity statin indicated.  -Start atorvastatin 40 mg daily  Written patient instructions provided.  Total time in face to face counseling 32 minutes.  Follow up Pharmacist on 02/16/2021  Thank you for allowing pharmacy to participate in this patient's care.  Enos Fling, PharmD PGY1 Pharmacy Resident 02/01/2021 4:52 PM

## 2021-02-02 LAB — CMP14+EGFR
ALT: 16 IU/L (ref 0–32)
AST: 12 IU/L (ref 0–40)
Albumin/Globulin Ratio: 1.7 (ref 1.2–2.2)
Albumin: 4.5 g/dL (ref 3.8–4.8)
Alkaline Phosphatase: 78 IU/L (ref 44–121)
BUN/Creatinine Ratio: 15 (ref 9–23)
BUN: 13 mg/dL (ref 6–20)
Bilirubin Total: 0.3 mg/dL (ref 0.0–1.2)
CO2: 23 mmol/L (ref 20–29)
Calcium: 9.5 mg/dL (ref 8.7–10.2)
Chloride: 102 mmol/L (ref 96–106)
Creatinine, Ser: 0.85 mg/dL (ref 0.57–1.00)
Globulin, Total: 2.6 g/dL (ref 1.5–4.5)
Glucose: 178 mg/dL — ABNORMAL HIGH (ref 70–99)
Potassium: 4 mmol/L (ref 3.5–5.2)
Sodium: 139 mmol/L (ref 134–144)
Total Protein: 7.1 g/dL (ref 6.0–8.5)
eGFR: 93 mL/min/{1.73_m2} (ref >59)

## 2021-02-02 LAB — LIPID PANEL
Chol/HDL Ratio: 5.3 ratio — ABNORMAL HIGH (ref 0.0–4.4)
Cholesterol, Total: 203 mg/dL — ABNORMAL HIGH (ref 100–199)
HDL: 38 mg/dL — ABNORMAL LOW (ref >39)
LDL Chol Calc (NIH): 113 mg/dL — ABNORMAL HIGH (ref 0–99)
Triglycerides: 300 mg/dL — ABNORMAL HIGH (ref 0–149)
VLDL Cholesterol Cal: 52 mg/dL — ABNORMAL HIGH (ref 5–40)

## 2021-02-14 NOTE — Progress Notes (Signed)
Patient was called and she said that she will start back taking her medication. Per provider she will hold off on adding any new medication or increasing her medication for right now

## 2021-02-16 ENCOUNTER — Other Ambulatory Visit: Payer: Self-pay

## 2021-02-16 ENCOUNTER — Emergency Department (HOSPITAL_COMMUNITY)
Admission: EM | Admit: 2021-02-16 | Discharge: 2021-02-16 | Disposition: A | Discharge status: Home / Self Care | Payer: Medicaid Other | Attending: Emergency Medicine | Admitting: Emergency Medicine

## 2021-02-16 ENCOUNTER — Emergency Department (HOSPITAL_COMMUNITY): Payer: Medicaid Other

## 2021-02-16 ENCOUNTER — Ambulatory Visit: Payer: Medicaid Other | Admitting: Pharmacist

## 2021-02-16 ENCOUNTER — Encounter (HOSPITAL_COMMUNITY): Payer: Self-pay | Admitting: Emergency Medicine

## 2021-02-16 DIAGNOSIS — R109 Unspecified abdominal pain: Secondary | ICD-10-CM | POA: Diagnosis present

## 2021-02-16 DIAGNOSIS — N3001 Acute cystitis with hematuria: Secondary | ICD-10-CM | POA: Diagnosis not present

## 2021-02-16 DIAGNOSIS — E119 Type 2 diabetes mellitus without complications: Secondary | ICD-10-CM | POA: Diagnosis not present

## 2021-02-16 DIAGNOSIS — D72829 Elevated white blood cell count, unspecified: Secondary | ICD-10-CM | POA: Diagnosis not present

## 2021-02-16 DIAGNOSIS — Z7984 Long term (current) use of oral hypoglycemic drugs: Secondary | ICD-10-CM | POA: Insufficient documentation

## 2021-02-16 LAB — COMPREHENSIVE METABOLIC PANEL
ALT: 36 U/L (ref 0–44)
AST: 20 U/L (ref 15–41)
Albumin: 4.2 g/dL (ref 3.5–5.0)
Alkaline Phosphatase: 61 U/L (ref 38–126)
Anion gap: 8 (ref 5–15)
BUN: 11 mg/dL (ref 6–20)
CO2: 25 mmol/L (ref 22–32)
Calcium: 9.3 mg/dL (ref 8.9–10.3)
Chloride: 105 mmol/L (ref 98–111)
Creatinine, Ser: 0.89 mg/dL (ref 0.44–1.00)
GFR, Estimated: >60 mL/min (ref >60)
Glucose, Bld: 121 mg/dL — ABNORMAL HIGH (ref 70–99)
Potassium: 4.2 mmol/L (ref 3.5–5.1)
Sodium: 138 mmol/L (ref 135–145)
Total Bilirubin: 0.6 mg/dL (ref 0.3–1.2)
Total Protein: 7.7 g/dL (ref 6.5–8.1)

## 2021-02-16 LAB — I-STAT BETA HCG BLOOD, ED (MC, WL, AP ONLY): I-stat hCG, quantitative: <5 m[IU]/mL

## 2021-02-16 LAB — URINALYSIS, MICROSCOPIC (REFLEX): WBC, UA: >50 WBC/hpf (ref 0–5)

## 2021-02-16 LAB — URINALYSIS, ROUTINE W REFLEX MICROSCOPIC
Bilirubin Urine: NEGATIVE
Glucose, UA: NEGATIVE mg/dL
Ketones, ur: NEGATIVE mg/dL
Nitrite: NEGATIVE
Protein, ur: 30 mg/dL — AB
Specific Gravity, Urine: >1.03 — ABNORMAL HIGH (ref 1.005–1.030)
pH: 6 (ref 5.0–8.0)

## 2021-02-16 LAB — CBC WITH DIFFERENTIAL/PLATELET
Abs Immature Granulocytes: 0.02 10*3/uL (ref 0.00–0.07)
Basophils Absolute: 0 10*3/uL (ref 0.0–0.1)
Basophils Relative: 1 %
Eosinophils Absolute: 0.1 10*3/uL (ref 0.0–0.5)
Eosinophils Relative: 2 %
HCT: 49 % — ABNORMAL HIGH (ref 36.0–46.0)
Hemoglobin: 15.4 g/dL — ABNORMAL HIGH (ref 12.0–15.0)
Immature Granulocytes: 0 %
Lymphocytes Relative: 26 %
Lymphs Abs: 2.2 10*3/uL (ref 0.7–4.0)
MCH: 27.8 pg (ref 26.0–34.0)
MCHC: 31.4 g/dL (ref 30.0–36.0)
MCV: 88.6 fL (ref 80.0–100.0)
Monocytes Absolute: 0.5 10*3/uL (ref 0.1–1.0)
Monocytes Relative: 6 %
Neutro Abs: 5.4 10*3/uL (ref 1.7–7.7)
Neutrophils Relative %: 65 %
Platelets: 312 10*3/uL (ref 150–400)
RBC: 5.53 MIL/uL — ABNORMAL HIGH (ref 3.87–5.11)
RDW: 12.1 % (ref 11.5–15.5)
WBC: 8.3 10*3/uL (ref 4.0–10.5)
nRBC: 0 % (ref 0.0–0.2)

## 2021-02-16 LAB — LIPASE, BLOOD: Lipase: 39 U/L (ref 11–51)

## 2021-02-16 MED ORDER — CEFADROXIL 500 MG PO CAPS: 500.0000 mg | ORAL_CAPSULE | Freq: Two times a day (BID) | ORAL | 0 refills | Status: DC

## 2021-02-16 MED ORDER — OXYCODONE-ACETAMINOPHEN 5-325 MG PO TABS
1.0000 | ORAL_TABLET | Freq: Once | ORAL | Status: AC
  Administered 2021-02-16: 1 via ORAL
  Filled 2021-02-16: qty 1

## 2021-02-16 NOTE — ED Triage Notes (Signed)
Patient arrives ambulatory c/o back pain that wraps around into upper abdomen onset of yesterday that worsened today while at work. Denies any injury. Reports hx of kidney stones and new diagnosis of diabetes.

## 2021-02-16 NOTE — ED Provider Notes (Signed)
Emergency Medicine Provider Triage Evaluation Note  Caitlin Mejia , a 35 y.o. female  was evaluated in triage.  Pt complains of bilateral flank pain radiating to the abdomen.  Denies any urinary symptoms.  Patient has a history of diabetes and kidney stones.  Denies any dysuria, hematuria, urinary urgency or frequency.  Denies any fevers.  Denies any traumas, falls, or MVC's.  Denies any heavy lifting or new activity.  Denies any weakness.  Patient ports feel similar to her kidney stones  Review of Systems  Positive: Flank pain Negative: Urinary symptoms  Physical Exam  BP (!) 124/92 (BP Location: Right Arm)   Pulse 83   Temp 98.3 F (36.8 C) (Oral)   Resp 18   Ht 5\' 2"  (1.575 m)   Wt 99.8 kg   LMP 02/09/2021   SpO2 100%   BMI 40.24 kg/m  Gen:   Awake, no distress   Resp:  Normal effort  MSK:   Moves extremities without difficulty  Other:  No CVA tenderness.  No midline tenderness.  Medical Decision Making  Medically screening exam initiated at 10:57 AM.  Appropriate orders placed.  Caitlin Mejia was informed that the remainder of the evaluation will be completed by another provider, this initial triage assessment does not replace that evaluation, and the importance of remaining in the ED until their evaluation is complete.  Labs ordered.  CT imaging deferred until evaluated in the back.   [name], PA-C 02/16/21 1059    02/18/21, MD 02/17/21 [phone]

## 2021-02-16 NOTE — ED Provider Notes (Signed)
[facility] EMERGENCY DEPARTMENT Provider Note   CSN: [id number] Arrival date & time: 02/16/21  7829     History Chief Complaint  Patient presents with   Back Pain    Caitlin Mejia is a 35 y.o. female with a past medical history significant for diabetes, gallstones, previous kidney stones who presents with 2 days of back pain, flank pain, radiating to the groin.  Patient reports that the flank pain is on both sides, not associated with nausea, vomiting.  Patient denies any dysuria, hematuria, urgency, or frequency.  Patient denies any recent fevers.  Patient denies any recent trauma or injury to the back.  She denies any numbness, tingling, weakness of the legs.  Patient has not taken anything for pain yet today.   Back Pain     Past Medical History:  Diagnosis Date   Diabetes mellitus without complication ([place])    Gall stones     Patient Active Problem List   Diagnosis Date Noted   Diabetes type 2, uncontrolled 04/02/2020   History of gestational diabetes 04/23/2012   History of shoulder dystocia in prior pregnancy, currently pregnant 10/16/2011    Past Surgical History:  Procedure Laterality Date   ORIF ANKLE FRACTURE Left 04/06/2020   Procedure: OPEN REDUCTION INTERNAL FIXATION (ORIF) DISTAL FIBULAR  FRACTURE;  Surgeon: Marchia Bond, MD;  Location: [place];  Service: Orthopedics;  Laterality: Left;   URETERAL STENT PLACEMENT     wisdom teeth       OB History     Gravida  6   Para  5   Term  5   Preterm  0   AB  0   Living  5      SAB  0   IAB  0   Ectopic  0   Multiple  0   Live Births  1           Family History  Problem Relation Age of Onset   Cerebral aneurysm Father    Diabetes Paternal Uncle     Social History   Tobacco Use   Smoking status: Never   Smokeless tobacco: Never  Substance Use Topics   Alcohol use: Yes    Comment: Rarely   Drug use: No    Home Medications Prior to  Admission medications   Medication Sig Start Date End Date Taking? Authorizing Provider  cefadroxil (DURICEF) 500 MG capsule Take 1 capsule (500 mg total) by mouth 2 (two) times daily. 02/16/21  Yes Lulabelle Desta H, PA-C  Accu-Chek Softclix Lancets lancets USE AS INSTRUCTED TO CHECK BLOOD SUGAR ONCE DAILY. 02/01/21   Dorna Mai, MD  atorvastatin (LIPITOR) 40 MG tablet Take 1 tablet (40 mg total) by mouth daily. 02/01/21   Dorna Mai, MD  Blood Glucose Monitoring Suppl (ACCU-CHEK GUIDE) w/Device KIT USE AS INSTRUCTED TO CHECK BLOOD SUGAR ONCE DAILY. 02/01/21   Dorna Mai, MD  Dulaglutide (TRULICITY) 5.62 ZH/0.8MV SOPN Inject 0.75 mg into the skin once a week. 02/01/21   Dorna Mai, MD  glucose blood (ACCU-CHEK GUIDE) test strip USE AS INSTRUCTED TO CHECK BLOOD SUGAR ONCE DAILY. 02/01/21   Dorna Mai, MD  metFORMIN (GLUCOPHAGE-XR) 500 MG 24 hr tablet Take 1 tablet (500 mg total) by mouth 2 (two) times daily with a meal. 01/25/21 01/25/22  Dorna Mai, MD  oxyCODONE (ROXICODONE) 5 MG immediate release tablet Take 1 tablet (5 mg total) by mouth every 4 (four) hours as needed for severe pain.  04/06/20   Ventura Bruns, PA-C    Allergies    Patient has no known allergies.  Review of Systems   Review of Systems  Musculoskeletal:  Positive for back pain.  All other systems reviewed and are negative.  Physical Exam Updated Vital Signs BP 107/79   Pulse 74   Temp 98.3 F (36.8 C) (Oral)   Resp 18   Ht _0  (1.575 m)   Wt 99.8 kg   LMP 02/09/2021   SpO2 99%   BMI 40.24 kg/m   Physical Exam Vitals and nursing note reviewed.  Constitutional:      General: She is not in acute distress.    Appearance: Normal appearance.  HENT:     Head: Normocephalic and atraumatic.  Eyes:     General:        Right eye: No discharge.        Left eye: No discharge.  Cardiovascular:     Rate and Rhythm: Normal rate and regular rhythm.     Heart sounds: No murmur heard.   No  friction rub. No gallop.  Pulmonary:     Effort: Pulmonary effort is normal.     Breath sounds: Normal breath sounds.  Abdominal:     General: Bowel sounds are normal.     Palpations: Abdomen is soft.     Comments: Tenderness palpation suprapubic.  No tenderness to palpation throughout the rest of the abdomen.  No CVA tenderness bilaterally.  No rebound, rigidity, guarding.  Musculoskeletal:     Comments: Intact strength 5 out of 5 bilateral upper and lower extremities.  Patient has some paraspinous muscle tenderness in the lumbar back.  Skin:    General: Skin is warm and dry.     Capillary Refill: Capillary refill takes less than 2 seconds.  Neurological:     Mental Status: She is alert and oriented to person, place, and time.  Psychiatric:        Mood and Affect: Mood normal.        Behavior: Behavior normal.    ED Results / Procedures / Treatments   Labs (all labs ordered are listed, but only abnormal results are displayed) Labs Reviewed  CBC WITH DIFFERENTIAL/PLATELET - Abnormal; Notable for the following components:      Result Value   RBC 5.53 (*)    Hemoglobin 15.4 (*)    HCT 49.0 (*)    All other components within normal limits  COMPREHENSIVE METABOLIC PANEL - Abnormal; Notable for the following components:   Glucose, Bld 121 (*)    All other components within normal limits  URINALYSIS, ROUTINE W REFLEX MICROSCOPIC - Abnormal; Notable for the following components:   APPearance CLOUDY (*)    Specific Gravity, Urine >1.030 (*)    Hgb urine dipstick SMALL (*)    Protein, ur 30 (*)    Leukocytes,Ua MODERATE (*)    All other components within normal limits  URINALYSIS, MICROSCOPIC (REFLEX) - Abnormal; Notable for the following components:   Bacteria, UA MANY (*)    All other components within normal limits  LIPASE, BLOOD  I-STAT BETA HCG BLOOD, ED ([facility], [facility], [facility] ONLY)    EKG None  Radiology CT Renal Stone Study  Result Date: 02/16/2021 CLINICAL DATA:  Bilateral  flank pain EXAM: CT ABDOMEN AND PELVIS WITHOUT CONTRAST TECHNIQUE: Multidetector CT imaging of the abdomen and pelvis was performed following the standard protocol without IV contrast. COMPARISON:  03/03/2020 FINDINGS: Lower chest: Unremarkable Hepatobiliary: Multiple  dependent gallstones in the gallbladder. Borderline prominence of the gallbladder size. No gallbladder wall thickening or pericholecystic fluid. The liver appears otherwise unremarkable. Pancreas: Unremarkable Spleen: Unremarkable Adrenals/Urinary Tract: Adrenal glands unremarkable. 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 Left kidney lower pole nonobstructive renal calculus measuring 0.7 cm in long axis, image 67 series 5. I cannot exclude duplicated renal collecting system on the right. No hydronephrosis or hydroureter. No ureteral stone observed. Stomach/Bowel: Unremarkable Vascular/Lymphatic: Unremarkable Reproductive: Unremarkable Other: No supplemental non-categorized findings. Musculoskeletal: Unremarkable IMPRESSION: 1. A specific cause for bilateral flank pain is not identified. 2. Nonobstructive left kidney lower pole calculus. 3. 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 4. Cholelithiasis, as shown on prior MRI of 02/20/2011. Electronically Signed   By: Van Clines M.D.   On: 02/16/2021 15:10    Procedures Procedures   Medications Ordered in ED Medications  oxyCODONE-acetaminophen (PERCOCET/ROXICET) 5-325 MG per tablet 1 tablet (has no administration in time range)    ED Course  I have reviewed the triage vital signs and the nursing notes.  Pertinent labs & imaging results that were available during my care of the patient were reviewed by me and considered in my medical decision making (see chart for details).    MDM Rules/Calculators/A&P                         Overall well-appearing patient who is in no distress who presents with 2 days  of back pain, flank pain, and concern for possible kidney stones.  Patient reports that she has had no urinary symptoms including dysuria, hematuria, frequency, urgency.  Urinalysis shows hematuria, moderate leukocytosis, many bacteria.  CT stone scan is clear.  Patient with probable UTI, unlikely pyelonephritis at this time, although she has some normal flank pain she has no fever, chills, or other systemic infectious symptoms.  Will prescribe cefadroxil, encouraged adequate hydration, return precautions given. Final Clinical Impression(s) / ED Diagnoses Final diagnoses:  Acute cystitis with hematuria    Rx / DC Orders ED Discharge Orders          Ordered    cefadroxil (DURICEF) 500 MG capsule  2 times daily        02/16/21 1531             Dorien Chihuahua 02/16/21 1532    Valarie Merino, MD 02/16/21 2322

## 2021-02-16 NOTE — Discharge Instructions (Signed)
Please take the entire course of antibiotics that I prescribed.  Encourage you to drink plenty of water. Please use Tylenol or ibuprofen for pain.  You may use 600 mg ibuprofen every 6 hours or 1000 mg of Tylenol every 6 hours.  You may choose to alternate between the 2.  This would be most effective.  Not to exceed 4 g of Tylenol within 24 hours.  Not to exceed 3200 mg ibuprofen 24 hours.

## 2021-02-25 ENCOUNTER — Ambulatory Visit: Payer: Medicaid Other | Attending: Family Medicine | Admitting: Pharmacist

## 2021-02-25 ENCOUNTER — Other Ambulatory Visit: Payer: Self-pay

## 2021-02-25 DIAGNOSIS — E1165 Type 2 diabetes mellitus with hyperglycemia: Secondary | ICD-10-CM

## 2021-02-25 MED ORDER — METFORMIN HCL ER 500 MG PO TB24: 500.0000 mg | ORAL_TABLET | Freq: Two times a day (BID) | ORAL | 0 refills | Status: DC

## 2021-02-25 MED ORDER — TRULICITY 0.75 MG/0.5ML ~~LOC~~ SOAJ: 0.7500 mg | SUBCUTANEOUS | 1 refills | Status: DC

## 2021-02-25 NOTE — Progress Notes (Signed)
    S:     Patient arrives in good spirits.  Presents for diabetes evaluation, education, and management. Patient was referred and last seen by Primary Care Provider on 01/25/2021. Pharmacy saw her on 02/01/2021 and started Trulicity.   Patient reports Diabetes that is diagnosed within the last year. Since addition of Trulicity, pt said she took one dose and felt fatigued and experienced GI upset. She skipped the next dose and plans to take another one this coming Sunday. Denies any current NV, abdominal pain.   Family/Social History:  -Never smoker -Father: cerebral aneurysm  Insurance coverage/medication affordability: [place] Medicaid  Medication adherence reported.    Current diabetes medications include:  -Metformin XR 500 mg BID -Trulicity 0.75 mg weekly   Patient denies hypoglycemic events.  Patient reported dietary habits: Eats 3 meals/day Breakfast:nothing Lunch:burgers, occasional fries, sweets with every meal Dinner: Baked chicken, veggies, pizza, spaghetti Snacks:little debbies, chips, crackers Drinks:water  Patient reported a desire to eat and cook healthier. Has an appointment with the nutritionist in Feb.   Patient-reported exercise habits: none   Patient reports nocturia (nighttime urination).  Patient reports neuropathy (nerve pain) about 1 months Patient reports visual changes. Patient denies self foot exams.     O:  Lab Results  Component Value Date   HGBA1C 10.6 (A) 01/25/2021   There were no vitals filed for this visit.  Lipid Panel     Component Value Date/Time   CHOL 203 (H) 02/01/2021 1609   TRIG 300 (H) 02/01/2021 1609   HDL 38 (L) 02/01/2021 1609   CHOLHDL 5.3 (H) 02/01/2021 1609   LDLCALC 113 (H) 02/01/2021 1609   Does not have her meter. Tells me that her BG at home is the low 100-150 range.   Clinical Atherosclerotic Cardiovascular Disease (ASCVD): No  The ASCVD Risk score (Arnett DK, et al., 2019) failed to calculate for the  following reasons:   The 2019 ASCVD risk score is only valid for ages 55 to 34   A/P: Diabetes recently diagnosed, currently not at goal. Patient is  able to verbalize appropriate hypoglycemia management plan. Medication adherence appears adequate since last appointment. Control is suboptimal due to diet. -Continue Trulicity 0.75 mg every week for now d/t GI upset -Continue metformin XR 500 mg twice daily -Extensively discussed pathophysiology of diabetes, recommended lifestyle interventions, dietary effects on blood sugar control -Counseled on s/sx of and management of hypoglycemia -Next A1C anticipated 04/27/2020.   Written patient instructions provided.  Total time in face to face counseling 32 minutes.  Follow up Pharmacist in 1 month.  Thank you for allowing pharmacy to participate in this patient's care.  Butch Penny, PharmD, P[place]y [place], CPP Clinical Pharmacist [facility] [phone]

## 2021-03-27 NOTE — Progress Notes (Unsigned)
° °  S:    ?PCP: Dr. Wilson ? ?Patient presents for diabetes evaluation, education, and management. Patient was referred and last seen by Primary Care Provider on 01/25/2021. Seen by CPP 02/25/21 and current medications continued due to reported GI upset since started Trulicity at previous CPP visit.  ? ?***  ?-tolerating trulicity?  ?-due for A1c today ? ?Patient reports Diabetes that is diagnosed within the last year. *** Since addition of Trulicity, pt said she took one dose and felt fatigued and experienced GI upset. She skipped the next dose and plans to take another one this coming Sunday. Denies any current NV, abdominal pain.  ? ?Family/Social History:  ?-Never smoker ?-Father: cerebral aneurysm ? ?Insurance coverage/medication affordability: [place] Medicaid ? ?Medication adherence ***. ?   ?Current diabetes medications include: metformin XR 500 mg BID, Trulicity 0.75 mg weekly  ? ?Patient denies hypoglycemic events. ? ?Patient reported dietary habits: Eats 3 meals/day ?Breakfast:nothing ?Lunch:burgers, occasional fries, sweets with every meal ?Dinner: Baked chicken, veggies, pizza, spaghetti ?Snacks:little debbies, chips, crackers ?Drinks:water ? ?Patient reported a desire to eat and cook healthier. Has an appointment with the nutritionist in Feb.  ? ?Patient-reported exercise habits: none ?  ?Patient reports nocturia (nighttime urination).  ?Patient reports neuropathy (nerve pain) about 1 months ?Patient reports visual changes. ?Patient denies self foot exams.  ?  ?O: ? ?Lab Results  ?Component Value Date  ? HGBA1C 10.6 (A) 01/25/2021  ? ?There were no vitals filed for this visit. ? ?Lipid Panel  ?   ?Component Value Date/Time  ? CHOL 203 (H) 02/01/2021 1609  ? TRIG 300 (H) 02/01/2021 1609  ? HDL 38 (L) 02/01/2021 1609  ? CHOLHDL 5.3 (H) 02/01/2021 1609  ? LDLCALC 113 (H) 02/01/2021 1609  ? ?Does not have her meter. Tells me that her BG at home is the low 100-150 range.  ? ?Clinical Atherosclerotic  Cardiovascular Disease (ASCVD): No  ?The ASCVD Risk score (Arnett DK, et al., 2019) failed to calculate for the following reasons: ?  The 2019 ASCVD risk score is only valid for ages 40 to 79  ? ?A/P: ?Diabetes recently diagnosed, currently not at goal with most recent A1c 10.6 01/25/21. Patient is able to verbalize appropriate hypoglycemia management plan. Medication adherence appears adequate since last appointment. Control is suboptimal due to diet. ?-Continue Trulicity 0.75 mg every week for now d/t GI upset ?-Continue metformin XR 500 mg twice daily ?-Extensively discussed pathophysiology of diabetes, recommended lifestyle interventions, dietary effects on blood sugar control ?-Counseled on s/sx of and management of hypoglycemia ?-Next A1C anticipated 04/27/2020.  ? ?Written patient instructions provided.  Total time in face to face counseling 32 minutes.  Follow up Pharmacist in 1 month. ? ? ? ?

## 2021-03-28 ENCOUNTER — Ambulatory Visit: Payer: Medicaid Other | Admitting: Pharmacist

## 2021-03-30 ENCOUNTER — Other Ambulatory Visit: Payer: Self-pay

## 2021-03-30 ENCOUNTER — Ambulatory Visit: Payer: Medicaid Other | Attending: Family Medicine | Admitting: Pharmacist

## 2021-03-30 NOTE — Progress Notes (Unsigned)
° ° °  S:     Patient arrives in good spirits.  Presents for diabetes evaluation, education, and management. Patient was referred and last seen by Primary Care Provider on 01/25/2021. Pharmacy saw her on 02/25/2021 and continued Trulicity.  Patient reports Diabetes that is diagnosed within the last year. Since addition of Trulicity, pt said she took one dose and felt fatigued and experienced GI upset. She skipped the next dose and plans to take another one this coming Sunday. Denies any current NV, abdominal pain.   Family/Social History:  -Never smoker -Father: cerebral aneurysm  Insurance coverage/medication affordability: [place] Medicaid  Medication adherence reported.    Current diabetes medications include:  -Metformin XR 500 mg BID -Trulicity 0.75 mg weekly   Patient denies hypoglycemic events.  Patient reported dietary habits: Eats 3 meals/day Breakfast:nothing Lunch:burgers, occasional fries, sweets with every meal Dinner: Baked chicken, veggies, pizza, spaghetti Snacks:little debbies, chips, crackers Drinks:water  Patient reported a desire to eat and cook healthier. Has an appointment with the nutritionist in Feb.   Patient-reported exercise habits: none   Patient reports nocturia (nighttime urination).  Patient reports neuropathy (nerve pain) about 1 months Patient reports visual changes. Patient denies self foot exams.     O:  Lab Results  Component Value Date   HGBA1C 10.6 (A) 01/25/2021   There were no vitals filed for this visit.  Lipid Panel     Component Value Date/Time   CHOL 203 (H) 02/01/2021 1609   TRIG 300 (H) 02/01/2021 1609   HDL 38 (L) 02/01/2021 1609   CHOLHDL 5.3 (H) 02/01/2021 1609   LDLCALC 113 (H) 02/01/2021 1609   Does not have her meter. Tells me that her BG at home is the low 100-150 range.   Clinical Atherosclerotic Cardiovascular Disease (ASCVD): No  The ASCVD Risk score (Arnett DK, et al., 2019) failed to calculate for the  following reasons:   The 2019 ASCVD risk score is only valid for ages 38 to 39   A/P: Diabetes recently diagnosed, currently not at goal. Patient is  able to verbalize appropriate hypoglycemia management plan. Medication adherence appears adequate since last appointment. Control is suboptimal due to diet. -Continue Trulicity 0.75 mg every week for now d/t GI upset -Continue metformin XR 500 mg twice daily -Extensively discussed pathophysiology of diabetes, recommended lifestyle interventions, dietary effects on blood sugar control -Counseled on s/sx of and management of hypoglycemia -Next A1C anticipated 04/27/2020.   Written patient instructions provided.  Total time in face to face counseling 32 minutes.  Follow up Pharmacist in 1 month.  Thank you for allowing pharmacy to participate in this patient's care.  Butch Penny, PharmD, [place]y [place], CPP Clinical Pharmacist [facility] [phone]

## 2021-04-07 ENCOUNTER — Other Ambulatory Visit: Payer: Self-pay

## 2021-04-07 ENCOUNTER — Encounter: Payer: Self-pay | Admitting: Skilled Nursing Facility1

## 2021-04-07 ENCOUNTER — Encounter: Payer: Medicaid Other | Attending: Family Medicine | Admitting: Skilled Nursing Facility1

## 2021-04-07 DIAGNOSIS — E119 Type 2 diabetes mellitus without complications: Secondary | ICD-10-CM | POA: Insufficient documentation

## 2021-04-07 NOTE — Progress Notes (Signed)
Pt states she need to get a routine of cooking healthier for herself.   Diabetes medications: Metformin: 500 mg 2 times a day (sometimes overwhelmed with work and parenting so forgets to take it) Trulicity once weekly Pt states she was taken off the glipizide   Pt states she does get kidney stones. Pt states she used to have issues with constipation but since taking metformin no issues. Pt states when she eats too much sugar she will get a headache for example: 10 cookies + juice Pt states when she eats too much sugar she will urinate throughout the night.  Pt states she has 5 children.   Pt states she is a stress eater mostly when feeling overwhelmed. Pt states she currently doe snot check her blood sugar.   Labs: Triglycerides 300 Total cholesterol 203 LDL cholesterol 113 A1C 10.6  Pt states her job is very active.   Goals: -brush your teeth 2 times a day -floss daily -follow the detailed MyPlate marked for your convenience  -try batch cooking: soups, chili, and salads so you can scoop it out for work -try peanut butter english muffin for breakfast -limit fruit to 2-3 times a day -check your blood sugars at least once daily either fasting or 2 hours post prandial -avoid eating out and increase making meals from home -limit animal fast and increase plant fats -we will discuss emotional/stress eating next time  Diabetes Self-Management Education  Visit Type: First/Initial   04/07/2021  [name], identified by name and date of birth, is a 36 y.o. female with a diagnosis of Diabetes: Type 2.   ASSESSMENT  Height 5\' 2"  (1.575 m), weight 221 lb (100.2 kg), unknown if currently breastfeeding. Body mass index is 40.42 kg/m.   Diabetes Self-Management Education - 04/07/21 1516       Visit Information   Visit Type First/Initial      Initial Visit   Diabetes Type Type 2    Are you currently following a meal plan? No    Are you taking your medications as  prescribed? Yes      Health Coping   How would you rate your overall health? Fair      Psychosocial Assessment   Patient Belief/Attitude about Diabetes Motivated to manage diabetes    Self-care barriers None    Self-management support Friends;Family    Patient Concerns Nutrition/Meal planning    Special Needs None    Preferred Learning Style Visual;Auditory    Learning Readiness Contemplating    How often do you need to have someone help you when you read instructions, pamphlets, or other written materials from your doctor or pharmacy? 1 - Never      Pre-Education Assessment   Patient understands the diabetes disease and treatment process. Needs Instruction    Patient understands incorporating nutritional management into lifestyle. Needs Instruction    Patient undertands incorporating physical activity into lifestyle. Needs Instruction    Patient understands using medications safely. Needs Instruction    Patient understands monitoring blood glucose, interpreting and using results Needs Instruction    Patient understands prevention, detection, and treatment of acute complications. Needs Instruction    Patient understands prevention, detection, and treatment of chronic complications. Needs Instruction    Patient understands how to develop strategies to address psychosocial issues. Needs Instruction    Patient understands how to develop strategies to promote health/change behavior. Needs Instruction      Complications   Last HgB A1C per patient/outside source 10.6 %  How often do you check your blood sugar? 0 times/day (not testing)    Number of hyperglycemic episodes per week 1    Can you tell when your blood sugar is high? No    Have you had a dilated eye exam in the past 12 months? No    Have you had a dental exam in the past 12 months? No    Are you checking your feet? Yes    How many days per week are you checking your feet? 7      Dietary Intake   Breakfast skipped or fast  food    Snack (morning) cookie    Lunch fast food + cookie    Snack (afternoon) chocolate    Dinner chicken + green beans + yellow rice + baked beans    Beverage(s) lemonade, water, wine, liquor mixed drink      Exercise   Exercise Type Light (walking / raking leaves)    How many days per week to you exercise? 5    How many minutes per day do you exercise? 30    Total minutes per week of exercise 150      Patient Education   Previous Diabetes Education No    Disease state  Factors that contribute to the development of diabetes    Nutrition management  Role of diet in the treatment of diabetes and the relationship between the three main macronutrients and blood glucose level;Information on hints to eating out and maintain blood glucose control.    Physical activity and exercise  Role of exercise on diabetes management, blood pressure control and cardiac health.    Medications Reviewed patients medication for diabetes, action, purpose, timing of dose and side effects.    Acute complications Taught treatment of hypoglycemia - the 15 rule.    Chronic complications Relationship between chronic complications and blood glucose control;Lipid levels, blood glucose control and heart disease;Assessed and discussed foot care and prevention of foot problems;Dental care;Retinopathy and reason for yearly dilated eye exams    Psychosocial adjustment Role of stress on diabetes      Individualized Goals (developed by patient)   Nutrition Follow meal plan discussed;General guidelines for healthy choices and portions discussed    Physical Activity Exercise 5-7 days per week;30 minutes per day    Medications take my medication as prescribed    Monitoring  test my blood glucose as discussed;test blood glucose pre and post meals as discussed    Reducing Risk do foot checks daily;treat hypoglycemia with 15 grams of carbs if blood glucose less than 70mg /dL;increase portions of nuts and seeds;increase portions of  olive oil in diet;increase portions of healthy fats      Post-Education Assessment   Patient understands the diabetes disease and treatment process. Demonstrates understanding / competency    Patient understands incorporating nutritional management into lifestyle. Demonstrates understanding / competency    Patient undertands incorporating physical activity into lifestyle. Demonstrates understanding / competency    Patient understands using medications safely. Demonstrates understanding / competency    Patient understands monitoring blood glucose, interpreting and using results Demonstrates understanding / competency    Patient understands prevention, detection, and treatment of acute complications. Demonstrates understanding / competency    Patient understands prevention, detection, and treatment of chronic complications. Demonstrates understanding / competency    Patient understands how to develop strategies to address psychosocial issues. Demonstrates understanding / competency    Patient understands how to develop strategies to promote health/change behavior. Demonstrates understanding /  competency      Outcomes   Expected Outcomes Demonstrated interest in learning. Expect positive outcomes    Future DMSE 4-6 wks    Program Status Completed             Individualized Plan for Diabetes Self-Management Training:   Learning Objective:  Patient will have a greater understanding of diabetes self-management. Patient education plan is to attend individual and/or group sessions per assessed needs and concerns.   Plan:   There are no Patient Instructions on file for this visit.  Expected Outcomes:  Demonstrated interest in learning. Expect positive outcomes  Education material provided: ADA - How to Thrive: A Guide for Your Journey with Diabetes  If problems or questions, patient to contact team via:  Phone and Email  Future DSME appointment: 4-6 wks

## 2021-04-23 ENCOUNTER — Encounter (HOSPITAL_COMMUNITY): Payer: Self-pay | Admitting: Emergency Medicine

## 2021-04-23 ENCOUNTER — Ambulatory Visit (HOSPITAL_COMMUNITY)
Admission: EM | Admit: 2021-04-23 | Discharge: 2021-04-23 | Disposition: A | Discharge status: Home / Self Care | Payer: Medicaid Other | Attending: Emergency Medicine | Admitting: Emergency Medicine

## 2021-04-23 ENCOUNTER — Other Ambulatory Visit: Payer: Self-pay

## 2021-04-23 DIAGNOSIS — M778 Other enthesopathies, not elsewhere classified: Secondary | ICD-10-CM

## 2021-04-23 MED ORDER — KETOROLAC TROMETHAMINE 30 MG/ML IJ SOLN
INTRAMUSCULAR | Status: AC
  Filled 2021-04-23: qty 1

## 2021-04-23 MED ORDER — KETOROLAC TROMETHAMINE 30 MG/ML IJ SOLN
30.0000 mg | Freq: Once | INTRAMUSCULAR | Status: AC
  Administered 2021-04-23: 30 mg via INTRAMUSCULAR

## 2021-04-23 MED ORDER — NAPROXEN 250 MG PO TABS: 500.0000 mg | ORAL_TABLET | Freq: Two times a day (BID) | ORAL | 0 refills | Status: DC

## 2021-04-23 MED ORDER — CYCLOBENZAPRINE HCL 5 MG PO TABS: 5.0000 mg | ORAL_TABLET | Freq: Every day | ORAL | 0 refills | Status: DC

## 2021-04-23 NOTE — Discharge Instructions (Signed)
Your pain is most likely caused by irritation to the tendon due to overuse.   Take naproxen twice a day for the next 7 days  You may use muscle relaxer at bedtime for additional comfort, please remove this medication and acute drowsy  You may use heating pad in 15 minute intervals as needed for additional comfort  Begin stretching or massage affected area daily for 10 minutes as tolerated to further loosen muscles   When lying down place pillow underneath and between knees for support  You can wrap the area with Ace bandage for support while at work  If pain persist after recommended treatment or reoccurs if may be beneficial to follow up with orthopedic specialist for evaluation, this doctor specializes in the bones and can manage your symptoms long-term with options such as but not limited to imaging, medications or physical therapy

## 2021-04-23 NOTE — ED Triage Notes (Signed)
Pt presents with c/o right arm pain x 5 days. Pt states pain has gotten progressively worse.

## 2021-04-23 NOTE — ED Provider Notes (Signed)
[name]    CSN: [id number] Arrival date & time: 04/23/21  1015      History   Chief Complaint Chief Complaint  Patient presents with   Arm Pain    HPI Caitlin Mejia is a 36 y.o. female.   Patient presents with right anterior elbow pain for 1 week.  Pain does not radiate,  described as a aching.  Pain can be felt with bending of the arm and with gripping objects.  Denies numbness, tingling.  Endorses that in September 2022 she endorses that she injured her arm but over the last week she has been lifting heavier objects at work up to 85 pounds and contributes to current pain.  Has not attempted treatment of symptoms.  History of diabetes mellitus.     Past Medical History:  Diagnosis Date   Diabetes mellitus without complication ([place])    Gall stones    Kidney stones     Patient Active Problem List   Diagnosis Date Noted   Diabetes type 2, uncontrolled 04/02/2020   History of gestational diabetes 04/23/2012   History of shoulder dystocia in prior pregnancy, currently pregnant 10/16/2011    Past Surgical History:  Procedure Laterality Date   ORIF ANKLE FRACTURE Left 04/06/2020   Procedure: OPEN REDUCTION INTERNAL FIXATION (ORIF) DISTAL FIBULAR  FRACTURE;  Surgeon: Marchia Bond, MD;  Location: [facility];  Service: Orthopedics;  Laterality: Left;   URETERAL STENT PLACEMENT     wisdom teeth      OB History     Gravida  6   Para  5   Term  5   Preterm  0   AB  0   Living  5      SAB  0   IAB  0   Ectopic  0   Multiple  0   Live Births  1            Home Medications    Prior to Admission medications   Medication Sig Start Date End Date Taking? Authorizing Provider  Accu-Chek Softclix Lancets lancets USE AS INSTRUCTED TO CHECK BLOOD SUGAR ONCE DAILY. 02/01/21   Dorna Mai, MD  atorvastatin (LIPITOR) 40 MG tablet Take 1 tablet (40 mg total) by mouth daily. 02/01/21   Dorna Mai, MD  Blood Glucose  Monitoring Suppl (ACCU-CHEK GUIDE) w/Device KIT USE AS INSTRUCTED TO CHECK BLOOD SUGAR ONCE DAILY. 02/01/21   Dorna Mai, MD  cefadroxil (DURICEF) 500 MG capsule Take 1 capsule (500 mg total) by mouth 2 (two) times daily. 02/16/21   Prosperi, Christian H, PA-C  Dulaglutide (TRULICITY) 2.97 LG/9.2JJ SOPN Inject 0.75 mg into the skin once a week. 02/25/21   Charlott Rakes, MD  glucose blood (ACCU-CHEK GUIDE) test strip USE AS INSTRUCTED TO CHECK BLOOD SUGAR ONCE DAILY. 02/01/21   Dorna Mai, MD  metFORMIN (GLUCOPHAGE-XR) 500 MG 24 hr tablet Take 1 tablet (500 mg total) by mouth 2 (two) times daily with a meal. 02/25/21 02/25/22  Charlott Rakes, MD  oxyCODONE (ROXICODONE) 5 MG immediate release tablet Take 1 tablet (5 mg total) by mouth every 4 (four) hours as needed for severe pain. 04/06/20   Ventura Bruns, PA-C    Family History Family History  Problem Relation Age of Onset   Healthy Mother    Cerebral aneurysm Father    Diabetes Paternal Uncle     Social History Social History   Tobacco Use   Smoking status: Never   Smokeless  tobacco: Never  Substance Use Topics   Alcohol use: Yes    Comment: Rarely   Drug use: No     Allergies   Patient has no known allergies.   Review of Systems Review of Systems  Constitutional: Negative.   Respiratory: Negative.    Gastrointestinal: Negative.   Skin: Negative.   Neurological: Negative.     Physical Exam Triage Vital Signs ED Triage Vitals  Enc Vitals Group     BP 04/23/21 1045 113/66     Pulse Rate 04/23/21 1045 65     Resp 04/23/21 1045 16     Temp 04/23/21 1045 98.5 F (36.9 C)     Temp Source 04/23/21 1045 Oral     SpO2 04/23/21 1045 100 %     Weight 04/23/21 1044 221 lb (100.2 kg)     Height 04/23/21 1044 _0  (1.575 m)     Head Circumference --      Peak Flow --      Pain Score 04/23/21 1044 4     Pain Loc --      Pain Edu? --      Excl. in [place]? --    No data found.  Updated Vital Signs BP 113/66 (BP  Location: Right Leg)    Pulse 65    Temp 98.5 F (36.9 C) (Oral)    Resp 16    Ht _1  (1.575 m)    Wt 221 lb (100.2 kg)    SpO2 100%    BMI 40.42 kg/m   Visual Acuity Right Eye Distance:   Left Eye Distance:   Bilateral Distance:    Right Eye Near:   Left Eye Near:    Bilateral Near:     Physical Exam Constitutional:      Appearance: Normal appearance.  HENT:     Head: Normocephalic.  Eyes:     Extraocular Movements: Extraocular movements intact.  Pulmonary:     Effort: Pulmonary effort is normal.  Musculoskeletal:     Comments: Tenderness along the anterior aspect of the right elbow predominantly following the brachioradialis, no point tenderness noted, no swelling, ecchymosis or deformity noted, 2+ brachial pulse  Skin:    General: Skin is warm and dry.  Neurological:     Mental Status: She is alert and oriented to person, place, and time. Mental status is at baseline.  Psychiatric:        Mood and Affect: Mood normal.        Behavior: Behavior normal.     UC Treatments / Results  Labs (all labs ordered are listed, but only abnormal results are displayed) Labs Reviewed - No data to display  EKG   Radiology No results found.  Procedures Procedures (including critical care time)  Medications Ordered in UC Medications - No data to display  Initial Impression / Assessment and Plan / UC Course  I have reviewed the triage vital signs and the nursing notes.  Pertinent labs & imaging results that were available during my care of the patient were reviewed by me and considered in my medical decision making (see chart for details).  Right elbow tendinitis  Etiology of symptoms is most likely inflammatory and an overuse injury related to lifting, discussed with patient, Toradol injection given in office for management of pain, prescribed naproxen 500 mg twice daily for 7 days and Flexeril 10 mg at bedtime as needed, recommended RICE, heat and for 2-minute intervals,  Ace wrap and pillows for  support, massage as needed, work note given, may follow-up with urgent care or orthopedic specialist for persistent or recurring pain Final Clinical Impressions(s) / UC Diagnoses   Final diagnoses:  None   Discharge Instructions   None    ED Prescriptions   None    PDMP not reviewed this encounter.   Hans Eden, NP 04/23/21 1121

## 2021-04-27 ENCOUNTER — Ambulatory Visit: Payer: Medicaid Other | Admitting: Family Medicine

## 2021-04-29 ENCOUNTER — Emergency Department (HOSPITAL_COMMUNITY)
Admission: EM | Admit: 2021-04-29 | Discharge: 2021-04-29 | Disposition: A | Discharge status: Home / Self Care | Payer: Medicaid Other | Attending: Emergency Medicine | Admitting: Emergency Medicine

## 2021-04-29 ENCOUNTER — Encounter (HOSPITAL_COMMUNITY): Payer: Self-pay | Admitting: Emergency Medicine

## 2021-04-29 ENCOUNTER — Emergency Department (HOSPITAL_COMMUNITY): Payer: Medicaid Other

## 2021-04-29 DIAGNOSIS — X500XXA Overexertion from strenuous movement or load, initial encounter: Secondary | ICD-10-CM | POA: Diagnosis not present

## 2021-04-29 DIAGNOSIS — Z7984 Long term (current) use of oral hypoglycemic drugs: Secondary | ICD-10-CM | POA: Diagnosis not present

## 2021-04-29 DIAGNOSIS — M25521 Pain in right elbow: Secondary | ICD-10-CM | POA: Diagnosis present

## 2021-04-29 DIAGNOSIS — Y990 Civilian activity done for income or pay: Secondary | ICD-10-CM | POA: Diagnosis not present

## 2021-04-29 DIAGNOSIS — M7711 Lateral epicondylitis, right elbow: Secondary | ICD-10-CM | POA: Diagnosis not present

## 2021-04-29 DIAGNOSIS — Z79899 Other long term (current) drug therapy: Secondary | ICD-10-CM | POA: Diagnosis not present

## 2021-04-29 NOTE — ED Provider Triage Note (Signed)
Emergency Medicine Provider Triage Evaluation Note  Caitlin Mejia , a 36 y.o. female  was evaluated in triage.  Pt complains of right elbow pain for the past week.  She presented to urgent care when symptoms began after lifting a heavy mirror off its hinges.  She was told she has tendinitis and was given NSAIDs and muscle relaxer.  A week later she continues to have pain.  Denies any numbness or weakness.  Review of Systems  Positive: Right elbow pain Negative: Numbness, weakness  Physical Exam  BP 125/71 (BP Location: Left Arm)    Pulse 71    Temp 98.3 F (36.8 C) (Oral)    Resp 16    SpO2 100%  Gen:   Awake, no distress   Resp:  Normal effort  MSK:   Moves extremities without difficulty  Other:  Normal range of motion of right elbow without edema, erythema or warmth of joint.  2+ radial pulse palpated.  No deformities  Medical Decision Making  Medically screening exam initiated at 11:48 AM.  Appropriate orders placed.  Caitlin Mejia was informed that the remainder of the evaluation will be completed by another provider, this initial triage assessment does not replace that evaluation, and the importance of remaining in the ED until their evaluation is complete.  X-ray ordered   Dietrich Pates, PA-C 04/29/21 1149

## 2021-04-29 NOTE — Discharge Instructions (Signed)
Please follow up with orthopedist Dr. Erlinda Hong for further evaluation of your elbow pain  I would recommend a specific brace for tennis elbow as well as rest, ice, and elevation.   You can take 800 mg Ibuprofen (4 OTC tablets) every 8 hours as needed for pain and 1,000 mg Tylenol (2 double strength OTC tablets) every 8 hours as needed for pain. You can alternate each medication every 4 hours to stay ontop of your pain.   Return to the ED for any new/worsening symptoms

## 2021-04-29 NOTE — ED Provider Notes (Signed)
[facility] EMERGENCY DEPARTMENT Provider Note   CSN: [id number] Arrival date & time: 04/29/21  1135     History  Chief Complaint  Patient presents with   Arm Pain    Caitlin Mejia is a 36 y.o. female who presents to the ED today with complaint of continued R elbow pain s/p heavy lifting at work last week.  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  She states that this dissipated over the course of a few days and she did not think much of it.  The other day while at work she was picking up an 80 pound mirror and began having worsening/new pain in the right elbow that felt similar to previous pain.  She went to urgent care on 02/04 and was provided with a Toradol injection and prescribed naproxen and muscle relaxer to take.  Patient states that the muscle relaxer makes her sleepy and the naproxen is not doing much for pain.  She has been wearing an elbow brace and putting either a heating pad or an ice pack in the elbow brace with some relief.  She states that she has pain most pacifically with lifting heavy objects as well as external rotation of the elbow itself.  She denies fevers, chills, swelling, redness to the joint, any other associated symptoms.   The history is provided by the patient and medical records.      Home Medications Prior to Admission medications   Medication Sig Start Date End Date Taking? Authorizing Provider  Accu-Chek Softclix Lancets lancets USE AS INSTRUCTED TO CHECK BLOOD SUGAR ONCE DAILY. 02/01/21   Dorna Mai, MD  atorvastatin (LIPITOR) 40 MG tablet Take 1 tablet (40 mg total) by mouth daily. 02/01/21   Dorna Mai, MD  Blood Glucose Monitoring Suppl (ACCU-CHEK GUIDE) w/Device KIT USE AS INSTRUCTED TO CHECK BLOOD SUGAR ONCE DAILY. 02/01/21   Dorna Mai, MD  cefadroxil (DURICEF) 500 MG capsule Take 1 capsule (500 mg total) by mouth 2 (two)  times daily. 02/16/21   Prosperi, Christian H, PA-C  cyclobenzaprine (FLEXERIL) 5 MG tablet Take 1 tablet (5 mg total) by mouth at bedtime. 04/23/21   White, Leitha Schuller, NP  Dulaglutide (TRULICITY) 9.62 XB/2.8UX SOPN Inject 0.75 mg into the skin once a week. 02/25/21   Charlott Rakes, MD  glucose blood (ACCU-CHEK GUIDE) test strip USE AS INSTRUCTED TO CHECK BLOOD SUGAR ONCE DAILY. 02/01/21   Dorna Mai, MD  metFORMIN (GLUCOPHAGE-XR) 500 MG 24 hr tablet Take 1 tablet (500 mg total) by mouth 2 (two) times daily with a meal. 02/25/21 02/25/22  Charlott Rakes, MD  naproxen (NAPROSYN) 250 MG tablet Take 2 tablets (500 mg total) by mouth 2 (two) times daily with a meal. 04/23/21   White, Leitha Schuller, NP  oxyCODONE (ROXICODONE) 5 MG immediate release tablet Take 1 tablet (5 mg total) by mouth every 4 (four) hours as needed for severe pain. 04/06/20   Ventura Bruns, PA-C      Allergies    Patient has no known allergies.    Review of Systems   Review of Systems  Constitutional:  Negative for chills and fever.  Musculoskeletal:  Positive for arthralgias. Negative for joint swelling.  Neurological:  Negative for weakness and numbness.  All other systems reviewed and are negative.  Physical Exam Updated Vital Signs BP 125/71 (BP Location: Left Arm)    Pulse 71  Temp 98.3 F (36.8 C) (Oral)    Resp 16    LMP 04/29/2021    SpO2 100%  Physical Exam Vitals and nursing note reviewed.  Constitutional:      Appearance: She is not ill-appearing.  HENT:     Head: Normocephalic and atraumatic.  Eyes:     Conjunctiva/sclera: Conjunctivae normal.  Cardiovascular:     Rate and Rhythm: Normal rate and regular rhythm.  Pulmonary:     Effort: Pulmonary effort is normal.     Breath sounds: Normal breath sounds.  Musculoskeletal:     Comments: No obvious swelling noted to R elbow. No overlying skin changes including erythema or increased warmth to the touch. ROM intact to elbow. Mild TTP to lateral  epicondyle. Strength 5/5 to BUEs. Sensation intact throughout. 2+ radial pulse.   Skin:    General: Skin is warm and dry.     Coloration: Skin is not jaundiced.  Neurological:     Mental Status: She is alert.    ED Results / Procedures / Treatments   Labs (all labs ordered are listed, but only abnormal results are displayed) Labs Reviewed - No data to display  EKG None  Radiology DG Elbow Complete Right  Result Date: 04/29/2021 CLINICAL DATA:  Right elbow pain EXAM: RIGHT ELBOW - COMPLETE 3+ VIEW COMPARISON:  None. FINDINGS: There is no evidence of acute fracture or joint effusion. Normal alignment. No significant arthropathy. Soft tissues are unremarkable. IMPRESSION: Negative right elbow radiographs. Electronically Signed   By: Maurine Simmering M.D.   On: 04/29/2021 12:24    Procedures Procedures    Medications Ordered in ED Medications - No data to display  ED Course/ Medical Decision Making/ A&P                           Medical Decision Making 36year-old female who presents to the ED today with continued right elbow pain after lifting heavy object at work last week.  Was initially evaluated at urgent care, has been taking muscle relaxer and naproxen without relief.  On arrival to the ED today vitals are stable.  Patient was medically screened and x-ray ordered.  X-ray of the right elbow negative for any acute findings.  On my exam she has no overlying skin changes to this area including redness, swelling, increased warmth.  She is neurovascularly intact throughout. She is noted to have TTP to the lateral epicondyle. Given pain is worse with flexion of elbow/lifting heavy object and external rotation suspect lateral epicondylitis vs tendonitis at this time. Have recommended specific arm brace for same and RICE therapy. Have recommend alternating Ibuprofen and Tylenol for pain and ortho follow up. Pt provided with worknote for decreased heavy lifting load. She is in agreement with plan  and stable for discharge home.   Amount and/or Complexity of Data Reviewed Radiology:  Decision-making details documented in ED Course.          Final Clinical Impression(s) / ED Diagnoses Final diagnoses:  Right elbow pain  Lateral epicondylitis of right elbow    Rx / DC Orders ED Discharge Orders     None        Discharge Instructions      Please follow up with orthopedist Dr. Erlinda Hong for further evaluation of your elbow pain  I would recommend a specific brace for tennis elbow as well as rest, ice, and elevation.   You can take 800 mg Ibuprofen (4  OTC tablets) every 8 hours as needed for pain and 1,000 mg Tylenol (2 double strength OTC tablets) every 8 hours as needed for pain. You can alternate each medication every 4 hours to stay ontop of your pain.   Return to the ED for any new/worsening symptoms       Eustaquio Maize, Hershal Coria 04/29/21 1338    Isla Pence, MD 04/29/21 1430

## 2021-04-29 NOTE — ED Triage Notes (Signed)
Patient here for re-evaluation of right arm pain that started at work one week ago when she was lifting a heavy mirror. Patient seen at urgent care, diagnosed with tendonitis and given medication but patient states the pain is not improving and the pills only put her to sleep. Patient alert, oriented, and in no apparent distress at this time.

## 2021-06-02 ENCOUNTER — Other Ambulatory Visit: Payer: Self-pay

## 2021-06-02 ENCOUNTER — Encounter: Payer: Medicaid Other | Attending: Family Medicine | Admitting: Skilled Nursing Facility1

## 2021-06-02 DIAGNOSIS — E119 Type 2 diabetes mellitus without complications: Secondary | ICD-10-CM | POA: Insufficient documentation

## 2021-06-02 NOTE — Progress Notes (Signed)
Pt states she did start checking her blood sugars but stopped stating last time it was about 250.  ?Pt states she recently quit her stressful job.  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  ? ?Pt states she noticed when her blood sugar is high she will feel fatigued and will get a headache and polyurination.  ?Pt states he has not been adding greens like she knows she needs to. Pt states she struggles with motivation. Pt states she struggles with time management and is trying to figure out who she wants to be and where she wants to be in her life.  ?Pt states she missed her appt her her diabetes stating she will reschedule. ? ?Pt states she is really struggling to focus on her own care with her 5 children to care for.  ? ? ?Labs: from previous record ?Triglycerides 300 ?Total cholesterol 203 ?LDL cholesterol 113 ?A1C 10.6 ? ?Goals: ?-continue: brush your teeth 2 times a day  ?-continue: floss daily ?-continue: follow the detailed MyPlate marked for your convenience  ?-continue: try batch cooking: soups, chili, and salads so you can scoop it out for work ?-continue: try peanut butter english muffin for breakfast ?-continue: limit fruit to 2-3 times a day ?-continue: check your blood sugars at least once daily either fasting or 2 hours post prandial ?-continue: avoid eating out and increase making meals from home ?-continue: limit animal fast and increase plant fast ? ?-NEW: carry spinach around with you for lunch and dinner ?-NEW: create a calendar  ? ?-we will discuss emotional/stress eating next time ? ? ?

## 2021-06-24 ENCOUNTER — Ambulatory Visit: Payer: Medicaid Other | Admitting: Pharmacist

## 2021-07-14 ENCOUNTER — Ambulatory Visit: Payer: Medicaid Other | Admitting: Pharmacist

## 2021-07-14 ENCOUNTER — Ambulatory Visit: Payer: Medicaid Other | Admitting: Skilled Nursing Facility1

## 2021-07-22 NOTE — Progress Notes (Unsigned)
? ?  S:    ?PCP: Dr. Andrey Campanile ? ?Patient presents for diabetes evaluation, education, and management. Patient was referred and last seen by Primary Care Provider on 01/25/2021. Seen by CPP 02/25/21 and current medications continued due to reported GI upset since started Trulicity at previous CPP visit.  ? ?***  ?-tolerating trulicity?  ?-due for A1c today ? ?Patient reports Diabetes that is diagnosed within the last year. *** Since addition of Trulicity, pt said she took one dose and felt fatigued and experienced GI upset. She skipped the next dose and plans to take another one this coming Sunday. Denies any current NV, abdominal pain.  ? ?Family/Social History:  ?-Never smoker ?-Father: cerebral aneurysm ? ?Insurance coverage/medication affordability: [place] Medicaid ? ?Medication adherence ***. ?   ?Current diabetes medications include: metformin XR 500 mg BID, Trulicity 0.75 mg weekly  ? ?Patient denies hypoglycemic events. ? ?Patient reported dietary habits: Eats 3 meals/day ?Breakfast:nothing ?Lunch:burgers, occasional fries, sweets with every meal ?Dinner: Baked chicken, veggies, pizza, spaghetti ?Snacks:little debbies, chips, crackers ?Drinks:water ? ?Patient reported a desire to eat and cook healthier. Has an appointment with the nutritionist in Feb.  ? ?Patient-reported exercise habits: none ?  ?Patient reports nocturia (nighttime urination).  ?Patient reports neuropathy (nerve pain) about 1 months ?Patient reports visual changes. ?Patient denies self foot exams.  ?  ?O: ? ?Lab Results  ?Component Value Date  ? HGBA1C 10.6 (A) 01/25/2021  ? ?There were no vitals filed for this visit. ? ?Lipid Panel  ?   ?Component Value Date/Time  ? CHOL 203 (H) 02/01/2021 1609  ? TRIG 300 (H) 02/01/2021 1609  ? HDL 38 (L) 02/01/2021 1609  ? CHOLHDL 5.3 (H) 02/01/2021 1609  ? LDLCALC 113 (H) 02/01/2021 1609  ? ?Does not have her meter. Tells me that her BG at home is the low 100-150 range.  ? ?Clinical Atherosclerotic  Cardiovascular Disease (ASCVD): No  ?The ASCVD Risk score (Arnett DK, et al., 2019) failed to calculate for the following reasons: ?  The 2019 ASCVD risk score is only valid for ages 44 to 24  ? ?A/P: ?Diabetes recently diagnosed, currently not at goal with most recent A1c 10.6 01/25/21. Patient is able to verbalize appropriate hypoglycemia management plan. Medication adherence appears adequate since last appointment. Control is suboptimal due to diet. ?-Continue Trulicity 0.75 mg every week for now d/t GI upset ?-Continue metformin XR 500 mg twice daily ?-Extensively discussed pathophysiology of diabetes, recommended lifestyle interventions, dietary effects on blood sugar control ?-Counseled on s/sx of and management of hypoglycemia ?-Next A1C anticipated 04/27/2020.  ? ?Written patient instructions provided.  Total time in face to face counseling 32 minutes.  Follow up Pharmacist in 1 month. ? ? ? ?

## 2021-07-25 ENCOUNTER — Ambulatory Visit: Payer: Medicaid Other | Admitting: Pharmacist

## 2021-07-28 ENCOUNTER — Ambulatory Visit: Payer: Medicaid Other | Attending: Family Medicine | Admitting: Pharmacist

## 2021-07-28 DIAGNOSIS — E1165 Type 2 diabetes mellitus with hyperglycemia: Secondary | ICD-10-CM

## 2021-07-28 LAB — POCT GLYCOSYLATED HEMOGLOBIN (HGB A1C): HbA1c, POC (controlled diabetic range): 9.9 % — AB (ref 0.0–7.0)

## 2021-07-28 MED ORDER — ACCU-CHEK GUIDE VI STRP: ORAL_STRIP | 2 refills | Status: DC

## 2021-07-28 MED ORDER — ACCU-CHEK GUIDE W/DEVICE KIT: PACK | 0 refills | Status: DC

## 2021-07-28 MED ORDER — TRULICITY 1.5 MG/0.5ML ~~LOC~~ SOAJ: 1.5000 mg | SUBCUTANEOUS | 3 refills | Status: DC

## 2021-07-28 MED ORDER — ACCU-CHEK SOFTCLIX LANCETS MISC: 2 refills | Status: DC

## 2021-07-28 NOTE — Progress Notes (Signed)
? ? ?  S:    ? ?Patient arrives in good spirits.  Presents for diabetes evaluation, education, and management. Patient was referred and last seen by Primary Care Provider on 01/25/2021. Pharmacy saw her on 02/25/2021 and continued Trulicity. ? ?Denies any current NV, abdominal pain. Tolerating the Trulicity well.  ? ?Family/Social History:  ?-Never smoker ?-Father: cerebral aneurysm ? ?Insurance coverage/medication affordability: [place] Medicaid ? ?Medication adherence reported. Denies any side effects to her medication.  ?Current diabetes medications include:  ?-Metformin XR [id number] mg BID ?-Trulicity [id number] mg weekly  ? ?Patient denies hypoglycemic events. ? ?Patient reported dietary habits: Eats 3 meals/day ?Breakfast: nothing. Always on the go - hard to eat breakfast.  ?Lunch:burgers, occasional fries, sweets with every meal ?Dinner: Baked chicken, veggies, pizza, spaghetti ?Snacks:little debbies, chips, crackers ?Drinks:water ? ?Patient reported a desire to eat and cook healthier. Has been seen by the Nutritionist.  ? ?Patient-reported exercise habits:  ?-None ?  ?Patient notes improvement in polyuria.  ?Patient denies neuropathy (nerve pain). ?Patient denies visual changes. ?Patient denies self foot exams.  ?  ?O: ? ?Lab Results  ?Component Value Date  ? HGBA1C 9.9 (A) 07/28/2021  ? ?There were no vitals filed for this visit. ? ?Lipid Panel  ?   ?Component Value Date/Time  ? CHOL 203 (H) 02/01/2021 1609  ? TRIG 300 (H) 02/01/2021 1609  ? HDL 38 (L) 02/01/2021 1609  ? CHOLHDL 5.3 (H) 02/01/2021 1609  ? LDLCALC 113 (H) 02/01/2021 1609  ? ?Does not have her meter.  ? ?Clinical Atherosclerotic Cardiovascular Disease (ASCVD): No  ?The ASCVD Risk score (Arnett DK, et al., 2019) failed to calculate for the following reasons: ?  The 2019 ASCVD risk score is only valid for ages 56 to 53  ? ?A/P: ?Diabetes currently not at goal. A1c is lower compared to 6 months ago but we need improved glycemic control. Patient is  able to  verbalize appropriate hypoglycemia management plan. Medication adherence appears adequate since last appointment. Control is suboptimal due to diet. ?-Increase Trulicity to 1.5 mg weekly. ?-Continue metformin XR 500 mg twice daily ?-Extensively discussed pathophysiology of diabetes, recommended lifestyle interventions, dietary effects on blood sugar control ?-Counseled on s/sx of and management of hypoglycemia ?-A1c, POCT ? ?Written patient instructions provided.  Total time in face to face counseling 30 minutes.  Follow up Pharmacist in 1 month. ? ?Thank you for allowing pharmacy to participate in this patient's care. ? ?Benard Halsted, PharmD, BCACP, CPP ?Clinical Pharmacist ?[facility] ?[phone] ? ? ? ?

## 2021-09-01 ENCOUNTER — Encounter: Payer: Self-pay | Admitting: Family Medicine

## 2021-09-01 ENCOUNTER — Ambulatory Visit (INDEPENDENT_AMBULATORY_CARE_PROVIDER_SITE_OTHER): Payer: Medicaid Other | Admitting: Family Medicine

## 2021-09-01 VITALS — BP 109/71 | HR 66 | Temp 98.1°F | Resp 16 | Wt 219.2 lb

## 2021-09-01 DIAGNOSIS — E785 Hyperlipidemia, unspecified: Secondary | ICD-10-CM | POA: Diagnosis not present

## 2021-09-01 DIAGNOSIS — Z794 Long term (current) use of insulin: Secondary | ICD-10-CM

## 2021-09-01 DIAGNOSIS — Z6841 Body Mass Index (BMI) 40.0 and over, adult: Secondary | ICD-10-CM | POA: Diagnosis not present

## 2021-09-01 DIAGNOSIS — E1169 Type 2 diabetes mellitus with other specified complication: Secondary | ICD-10-CM

## 2021-09-01 LAB — POCT GLYCOSYLATED HEMOGLOBIN (HGB A1C): Hemoglobin A1C: 8.8 % — AB (ref 4.0–5.6)

## 2021-09-01 NOTE — Progress Notes (Signed)
Established Patient Office Visit  Subjective    Patient ID: Caitlin Mejia, female    DOB: 05-18-1985  Age: 36 y.o. MRN: [id number]  CC: No chief complaint on file.   HPI Caitlin Mejia presents for follow up of diabetes. She denies acute complaints or concerns.    Outpatient Encounter Medications as of 09/01/2021  Medication Sig   Accu-Chek Softclix Lancets lancets Use as instructed   atorvastatin (LIPITOR) 40 MG tablet Take 1 tablet (40 mg total) by mouth daily.   Blood Glucose Monitoring Suppl (ACCU-CHEK GUIDE) w/Device KIT USE AS INSTRUCTED TO CHECK BLOOD SUGAR ONCE DAILY.   Dulaglutide (TRULICITY) 1.5 KN/3.9JQ SOPN Inject 1.5 mg into the skin once a week.   metFORMIN (GLUCOPHAGE-XR) 500 MG 24 hr tablet Take 1 tablet (500 mg total) by mouth 2 (two) times daily with a meal.   cyclobenzaprine (FLEXERIL) 5 MG tablet Take 1 tablet (5 mg total) by mouth at bedtime. (Patient not taking: Reported on 09/01/2021)   glucose blood (ACCU-CHEK GUIDE) test strip Use as instructed (Patient not taking: Reported on 09/01/2021)   naproxen (NAPROSYN) 250 MG tablet Take 2 tablets (500 mg total) by mouth 2 (two) times daily with a meal. (Patient not taking: Reported on 09/01/2021)   oxyCODONE (ROXICODONE) 5 MG immediate release tablet Take 1 tablet (5 mg total) by mouth every 4 (four) hours as needed for severe pain. (Patient not taking: Reported on 09/01/2021)   No facility-administered encounter medications on file as of 09/01/2021.    Past Medical History:  Diagnosis Date   Diabetes mellitus without complication ([facility])    Gall stones    Kidney stones     Past Surgical History:  Procedure Laterality Date   ORIF ANKLE FRACTURE Left 04/06/2020   Procedure: OPEN REDUCTION INTERNAL FIXATION (ORIF) DISTAL FIBULAR  FRACTURE;  Surgeon: Marchia Bond, MD;  Location: [facility];  Service: Orthopedics;  Laterality: Left;   URETERAL STENT PLACEMENT     wisdom teeth      Family  History  Problem Relation Age of Onset   Healthy Mother    Cerebral aneurysm Father    Diabetes Paternal Uncle     Social History   Socioeconomic History   Marital status: Single    Spouse name: Not on file   Number of children: Not on file   Years of education: Not on file   Highest education level: Not on file  Occupational History   Not on file  Tobacco Use   Smoking status: Never   Smokeless tobacco: Never  Substance and Sexual Activity   Alcohol use: Yes    Comment: Rarely   Drug use: No   Sexual activity: Yes    Birth control/protection: None  Other Topics Concern   Not on file  Social History Narrative   Not on file   Social Determinants of Health   Financial Resource Strain: Not on file  Food Insecurity: Not on file  Transportation Needs: Not on file  Physical Activity: Not on file  Stress: Not on file  Social Connections: Not on file  Intimate Partner Violence: Not on file    Review of Systems  All other systems reviewed and are negative.       Objective    BP 109/71   Pulse 66   Temp 98.1 F (36.7 C) (Oral)   Resp 16   Wt 219 lb 3.2 oz (99.4 kg)   SpO2 96%   BMI 40.09 kg/m  Physical Exam Vitals and nursing note reviewed.  Constitutional:      General: She is not in acute distress.    Appearance: She is obese.  Cardiovascular:     Rate and Rhythm: Normal rate and regular rhythm.  Pulmonary:     Effort: Pulmonary effort is normal.     Breath sounds: Normal breath sounds.  Abdominal:     Palpations: Abdomen is soft.     Tenderness: There is no abdominal tenderness.  Musculoskeletal:     Right lower leg: No edema.     Left lower leg: No edema.  Neurological:     General: No focal deficit present.     Mental Status: She is alert and oriented to person, place, and time.         Assessment & Plan:   1. Type 2 diabetes mellitus with other specified complication, with long-term current use of insulin (HCC) Improving A1c but  not yet at goal. Continue follow ups with Lurena Joiner and monitor - POCT glycosylated hemoglobin (Hb A1C)  2. Class 3 severe obesity due to excess calories with serious comorbidity and body mass index (BMI) of 40.0 to 44.9 in adult [facility]) Discussed dietary and activity options. Goal is 3-5lbs/mo wt loss.   3. Hyperlipidemia, unspecified hyperlipidemia type Continue present management    Return in about 3 months (around 12/02/2021) for follow up.   Becky Sax, MD

## 2021-09-01 NOTE — Progress Notes (Signed)
Patient is here for DM follow-up.Patient has no new concerns

## 2021-09-02 ENCOUNTER — Encounter: Payer: Self-pay | Admitting: Pharmacist

## 2021-09-02 ENCOUNTER — Ambulatory Visit: Payer: Medicaid Other | Attending: Family Medicine | Admitting: Pharmacist

## 2021-09-02 DIAGNOSIS — E1165 Type 2 diabetes mellitus with hyperglycemia: Secondary | ICD-10-CM | POA: Diagnosis not present

## 2021-09-02 MED ORDER — OZEMPIC (0.25 OR 0.5 MG/DOSE) 2 MG/3ML ~~LOC~~ SOPN: PEN_INJECTOR | SUBCUTANEOUS | 1 refills | Status: DC

## 2021-09-02 NOTE — Progress Notes (Signed)
    S:     Patient arrives in good spirits.  Presents for diabetes evaluation, education, and management. Patient was referred and last seen by Primary Care Provider on 09/01/2021. I last saw her on 07/28/2021 and increased her Trulicity.  Today, she tells me she had to stop the Trulicity d/t NV. Denies any current NV, abdominal pain. Denies any changes in vision.   Family/Social History:  -Never smoker -No alcohol use reported  -Father: cerebral aneurysm  Insurance coverage/medication affordability: [place] Medicaid  Medication adherence reported with metformin, however, she stopped Trulicity ~2 weeks ago d/t NV. Resolved upon discontinuation.  Current diabetes medications include:  -Metformin XR 607 mg BID -Trulicity 1.5 mg weekly   Patient denies hypoglycemic events.  Patient reported dietary habits: Eats 3 meals/day Breakfast: nothing. Always on the go - hard to eat breakfast.  Lunch:burgers, occasional fries, sweets with every meal Dinner: Baked chicken, veggies, pizza, spaghetti Snacks:little debbies, chips, crackers Drinks:water  Patient reported a desire to eat and cook healthier. Has been seen by the Nutritionist. Trying to do better overall.   Patient-reported exercise habits:  -None   Patient denies polyuria.  Patient denies neuropathy (nerve pain). Patient denies visual changes. Patient denies self foot exams.    O:  Lab Results  Component Value Date   HGBA1C 8.8 (A) 09/01/2021   There were no vitals filed for this visit.  Lipid Panel     Component Value Date/Time   CHOL 203 (H) 02/01/2021 1609   TRIG 300 (H) 02/01/2021 1609   HDL 38 (L) 02/01/2021 1609   CHOLHDL 5.3 (H) 02/01/2021 1609   LDLCALC 113 (H) 02/01/2021 1609   Does not have her meter.   Clinical Atherosclerotic Cardiovascular Disease (ASCVD): No  The ASCVD Risk score (Arnett DK, et al., 2019) failed to calculate for the following reasons:   The 2019 ASCVD risk score is only valid for ages  82 to 61   A/P: Diabetes currently not at goal, however, improving. A1c is lower compared to 6 months ago but we need improved glycemic control. Patient is able to verbalize appropriate hypoglycemia management plan. Medication adherence appears to be suboptimal. She cannot tolerate Trulicity at the 1.5 mg dose. Will change to Ozempic.. -Stop Trulicity. -Start Ozempic 0.25 mg weekly.  -Patient was educated on the use of the Ozempic pen. Reviewed necessary supplies and operation of the pen.  -Continue metformin XR 500 mg twice daily -Extensively discussed pathophysiology of diabetes, recommended lifestyle interventions, dietary effects on blood sugar control -Counseled on s/sx of and management of hypoglycemia -Labs: UACR, Lipid, CMP14+eGFR -Next A1c anticipated 11/2021  Written patient instructions provided.  Total time in face to face counseling 30 minutes.  Follow up Pharmacist in 1 month.  Thank you for allowing pharmacy to participate in this patient's care.  Benard Halsted, PharmD, [facility] [place], [place] [phone]

## 2021-09-03 LAB — MICROALBUMIN / CREATININE URINE RATIO
Creatinine, Urine: 152.1 mg/dL
Microalb/Creat Ratio: 27 mg/g creat (ref 0–29)
Microalbumin, Urine: 40.6 ug/mL

## 2021-09-03 LAB — CMP14+EGFR
ALT: 27 IU/L (ref 0–32)
AST: 10 IU/L (ref 0–40)
Albumin/Globulin Ratio: 1.9 (ref 1.2–2.2)
Albumin: 4.5 g/dL (ref 3.8–4.8)
Alkaline Phosphatase: 87 IU/L (ref 44–121)
BUN/Creatinine Ratio: 12 (ref 9–23)
BUN: 11 mg/dL (ref 6–20)
Bilirubin Total: 0.3 mg/dL (ref 0.0–1.2)
CO2: 22 mmol/L (ref 20–29)
Calcium: 9.6 mg/dL (ref 8.7–10.2)
Chloride: 100 mmol/L (ref 96–106)
Creatinine, Ser: 0.91 mg/dL (ref 0.57–1.00)
Globulin, Total: 2.4 g/dL (ref 1.5–4.5)
Glucose: 283 mg/dL — ABNORMAL HIGH (ref 70–99)
Potassium: 3.9 mmol/L (ref 3.5–5.2)
Sodium: 137 mmol/L (ref 134–144)
Total Protein: 6.9 g/dL (ref 6.0–8.5)
eGFR: 85 mL/min/{1.73_m2} (ref >59)

## 2021-09-03 LAB — LIPID PANEL
Chol/HDL Ratio: 3.9 ratio (ref 0.0–4.4)
Cholesterol, Total: 135 mg/dL (ref 100–199)
HDL: 35 mg/dL — ABNORMAL LOW (ref >39)
LDL Chol Calc (NIH): 67 mg/dL (ref 0–99)
Triglycerides: 198 mg/dL — ABNORMAL HIGH (ref 0–149)
VLDL Cholesterol Cal: 33 mg/dL (ref 5–40)

## 2021-09-08 ENCOUNTER — Telehealth: Payer: Self-pay

## 2021-09-08 ENCOUNTER — Other Ambulatory Visit: Payer: Self-pay

## 2021-09-08 NOTE — Telephone Encounter (Signed)
Ozempic PA approved until 09/09/22

## 2021-10-03 ENCOUNTER — Other Ambulatory Visit: Payer: Self-pay | Admitting: Family Medicine

## 2021-10-04 NOTE — Telephone Encounter (Signed)
Requested Prescriptions  Pending Prescriptions Disp Refills  . metFORMIN (GLUCOPHAGE-XR) 500 MG 24 hr tablet [Pharmacy Med Name: METFORMIN ER 500MG 24HR TABS] 180 tablet 1    Sig: TAKE 1 TABLET(500 MG) BY MOUTH TWICE DAILY WITH A MEAL     Endocrinology:  Diabetes - Biguanides Failed - 10/03/2021  4:13 PM      Failed - HBA1C is between 0 and 7.9 and within 180 days    Hemoglobin A1C  Date Value Ref Range Status  09/01/2021 8.8 (A) 4.0 - 5.6 % Final   HbA1c, POC (controlled diabetic range)  Date Value Ref Range Status  07/28/2021 9.9 (A) 0.0 - 7.0 % Final         Failed - B12 Level in normal range and within 720 days    No results found for: "VITAMINB12"       Passed - Cr in normal range and within 360 days    Creatinine, Ser  Date Value Ref Range Status  09/02/2021 0.91 0.57 - 1.00 mg/dL Final         Passed - eGFR in normal range and within 360 days    GFR calc Af Amer  Date Value Ref Range Status  04/22/2012 >90 >90 mL/min Final    Comment:           The eGFR has been calculated using the CKD EPI equation. This calculation has not been validated in all clinical situations. eGFR's persistently <90 mL/min signify possible Chronic Kidney Disease.   GFR, Estimated  Date Value Ref Range Status  02/16/2021 >60 >60 mL/min Final    Comment:    (NOTE) Calculated using the CKD-EPI Creatinine Equation (2021)    eGFR  Date Value Ref Range Status  09/02/2021 85 >59 mL/min/1.73 Final         Passed - Valid encounter within last 6 months    Recent Outpatient Visits          1 month ago Uncontrolled type 2 diabetes mellitus with hyperglycemia [facility])   [place], Annie Main L, RPH-CPP   1 month ago Type 2 diabetes mellitus with other specified complication, with long-term current use of insulin [facility])   Primary Care at [facility], Clyde Canterbury, MD   2 months ago Uncontrolled type 2 diabetes mellitus with hyperglycemia [facility])    [place], Annie Main L, RPH-CPP   7 months ago Uncontrolled type 2 diabetes mellitus with hyperglycemia [facility])   [place], Annie Main L, RPH-CPP   8 months ago Uncontrolled type 2 diabetes mellitus with hyperglycemia [facility])   [place], Jarome [place], RPH-CPP      Future Appointments            In 1 week Daisy Blossom, Jarome Matin, [place]   In 1 month Dorna Mai, MD Primary Care at [facility] - CBC within normal limits and completed in the last 12 months    WBC  Date Value Ref Range Status  02/16/2021 8.3 4.0 - 10.5 K/uL Final   RBC  Date Value Ref Range Status  02/16/2021 5.53 (H) 3.87 - 5.11 MIL/uL Final   Hemoglobin  Date Value Ref Range Status  02/16/2021 15.4 (H) 12.0 - 15.0 g/dL Final   HCT  Date Value Ref Range Status  02/16/2021 49.0 (H) 36.0 - 46.0 % Final   MCHC  Date Value Ref Range Status  02/16/2021 31.4 30.0 - 36.0 g/dL Final   [facility]  Date Value Ref Range Status  02/16/2021 27.8 26.0 - 34.0 pg Final   MCV  Date Value Ref Range Status  02/16/2021 88.6 80.0 - 100.0 fL Final   No results found for: "PLTCOUNTKUC", "LABPLAT", "POCPLA" RDW  Date Value Ref Range Status  02/16/2021 12.1 11.5 - 15.5 % Final

## 2021-10-11 ENCOUNTER — Ambulatory Visit: Payer: Medicaid Other | Attending: Family Medicine | Admitting: Pharmacist

## 2021-10-11 DIAGNOSIS — E1165 Type 2 diabetes mellitus with hyperglycemia: Secondary | ICD-10-CM

## 2021-10-11 MED ORDER — OZEMPIC (0.25 OR 0.5 MG/DOSE) 2 MG/3ML ~~LOC~~ SOPN: 0.5000 mg | PEN_INJECTOR | SUBCUTANEOUS | 0 refills | Status: DC

## 2021-10-11 MED ORDER — SEMAGLUTIDE (1 MG/DOSE) 4 MG/3ML ~~LOC~~ SOPN: 1.0000 mg | PEN_INJECTOR | SUBCUTANEOUS | 2 refills | Status: DC

## 2021-10-11 NOTE — Progress Notes (Signed)
    S:     Patient arrives in good spirits.  Presents for diabetes evaluation, education, and management. Patient was referred and last seen by Primary Care Provider on 09/01/2021. I last saw her on 09/02/2021 and changed her Trulicity to Ozempic d/t side effects.   Today, she tells me she stopped Ozempic after 3 weeks d/t lack of home blood sugar control. Denies any current NV, abdominal pain. Denies any changes in vision.   Family/Social History:  -Never smoker -No alcohol use reported  -Father: cerebral aneurysm  Insurance coverage/medication affordability: [place] Medicaid  Medication adherence reported with metformin, however, she stopped Ozempic ~2 weeks ago d/t lack of efficacy. She confirms she had no side effects while taking.  Current diabetes medications include:  -Metformin XR 500 mg BID -Ozempic 0.25 mg weekly  Patient denies hypoglycemic events.  Patient reported dietary habits: Eats 3 meals/day Breakfast: nothing. Always on the go - hard to eat breakfast.  Lunch:burgers, occasional fries, sweets with every meal Dinner: Baked chicken, veggies, pizza, spaghetti Snacks:little debbies, chips, crackers Drinks:water  Patient reported a desire to eat and cook healthier. Has been seen by the Nutritionist. Trying to do better overall.   Patient-reported exercise habits:  -None   Patient denies polyuria.  Patient denies neuropathy (nerve pain). Patient denies visual changes. Patient denies self foot exams.    O:  Lab Results  Component Value Date   HGBA1C 8.8 (A) 09/01/2021   There were no vitals filed for this visit.  Lipid Panel     Component Value Date/Time   CHOL 135 09/02/2021 1506   TRIG 198 (H) 09/02/2021 1506   HDL 35 (L) 09/02/2021 1506   CHOLHDL 3.9 09/02/2021 1506   LDLCALC 67 09/02/2021 1506   Does not have her meter.   Clinical Atherosclerotic Cardiovascular Disease (ASCVD): No  The ASCVD Risk score (Arnett DK, et al., 2019) failed to calculate  for the following reasons:   The 2019 ASCVD risk score is only valid for ages 62 to 64   A/P: Diabetes currently not at goal. Patient is able to verbalize appropriate hypoglycemia management plan. Medication adherence appears to be suboptimal. Will have her resume Ozempic at the 0.5 mg dose weekly. Advised her to increase to the 1 mg once weekly after 2 weeks of the 0.5 mg dose if she can tolerate.  -Resume Ozempic 0.5 mg weekly.  -Patient was educated on the use of the Ozempic pen. Reviewed necessary supplies and operation of the pen.  -Continue metformin XR 500 mg twice daily -Extensively discussed pathophysiology of diabetes, recommended lifestyle interventions, dietary effects on blood sugar control -Counseled on s/sx of and management of hypoglycemia -Next A1c anticipated 11/2021  Written patient instructions provided.  Total time in face to face counseling 30 minutes.  Follow up Pharmacist in 1 month.  Thank you for allowing pharmacy to participate in this patient's care.  Butch Penny, PharmD, Patsy [place], CPP Clinical Pharmacist [facility] & [facility] [phone]

## 2021-11-14 ENCOUNTER — Ambulatory Visit: Payer: Medicaid Other | Attending: Family Medicine | Admitting: Pharmacist

## 2021-11-14 ENCOUNTER — Encounter: Payer: Self-pay | Admitting: Pharmacist

## 2021-11-14 VITALS — Wt 208.0 lb

## 2021-11-14 DIAGNOSIS — E1165 Type 2 diabetes mellitus with hyperglycemia: Secondary | ICD-10-CM | POA: Diagnosis not present

## 2021-11-14 MED ORDER — CEFADROXIL 500 MG PO CAPS: 500.0000 mg | ORAL_CAPSULE | Freq: Two times a day (BID) | ORAL | 0 refills | Status: DC

## 2021-11-14 NOTE — Progress Notes (Signed)
    S:    PCP: Dr. Andrey Campanile   Patient arrives in good spirits.  Presents for diabetes evaluation, education, and management. Patient was referred and last seen by Primary Care Provider on 09/01/2021. I last saw her on 10/11/2021 and increased her Ozempic to 0.5 mg weekly.   Denies any current NV, abdominal pain. Denies any changes in vision.   Family/Social History:  -Never smoker -No alcohol use reported  -Father: cerebral aneurysm  Insurance coverage/medication affordability: [place] Medicaid  Medication adherence reported. Current diabetes medications include:  -Metformin XR 500 mg BID -Ozempic 0.5 mg weekly  Patient denies hypoglycemic events.  Patient reported dietary habits: Eats 3 meals/day Breakfast: nothing. Always on the go - hard to eat breakfast.  Lunch:burgers, occasional fries, sweets with every meal Dinner: Baked chicken, veggies, pizza, spaghetti Snacks:little debbies, chips, crackers Drinks:water  Patient reported a desire to eat and cook healthier. Has been seen by the Nutritionist. Trying to do better overall.   Patient-reported exercise habits:  -None   Patient denies polyuria.  Patient denies neuropathy (nerve pain). Patient denies visual changes. Patient denies self foot exams.    O:  Lab Results  Component Value Date   HGBA1C 8.8 (A) 09/01/2021   There were no vitals filed for this visit.  Lipid Panel     Component Value Date/Time   CHOL 135 09/02/2021 1506   TRIG 198 (H) 09/02/2021 1506   HDL 35 (L) 09/02/2021 1506   CHOLHDL 3.9 09/02/2021 1506   LDLCALC 67 09/02/2021 1506   Does not have her meter.   Clinical Atherosclerotic Cardiovascular Disease (ASCVD): No  The ASCVD Risk score (Arnett DK, et al., 2019) failed to calculate for the following reasons:   The 2019 ASCVD risk score is only valid for ages 73 to 28   A/P: Diabetes currently not at goal. Patient is able to verbalize appropriate hypoglycemia management plan. Medication  adherence is optimal. -Continue Ozempic 0.5 mg weekly.  -Patient was educated on the use of the Ozempic pen. Reviewed necessary supplies and operation of the pen.  -Continue metformin XR 500 mg twice daily. -Extensively discussed pathophysiology of diabetes, recommended lifestyle interventions, dietary effects on blood sugar control -Counseled on s/sx of and management of hypoglycemia -Next A1c anticipated 11/2021  Written patient instructions provided.  Total time in face to face counseling 30 minutes.  Follow up PCP next 1 month.  Thank you for allowing pharmacy to participate in this patient's care.  Butch Penny, PharmD, Patsy [place], CPP Clinical Pharmacist [facility] & [facility] [phone]

## 2021-12-01 ENCOUNTER — Ambulatory Visit (INDEPENDENT_AMBULATORY_CARE_PROVIDER_SITE_OTHER): Payer: Medicaid Other | Admitting: Family Medicine

## 2021-12-01 ENCOUNTER — Encounter: Payer: Self-pay | Admitting: Family Medicine

## 2021-12-01 DIAGNOSIS — E1169 Type 2 diabetes mellitus with other specified complication: Secondary | ICD-10-CM | POA: Diagnosis not present

## 2021-12-01 DIAGNOSIS — E785 Hyperlipidemia, unspecified: Secondary | ICD-10-CM

## 2021-12-01 DIAGNOSIS — Z794 Long term (current) use of insulin: Secondary | ICD-10-CM

## 2021-12-01 NOTE — Progress Notes (Signed)
Established Patient Office Visit  Subjective    Patient ID: Caitlin Mejia, female    DOB: [birth year]  Age: 36 y.o. MRN: [id number]  CC:  Chief Complaint  Patient presents with   Follow-up   Diabetes    HPI Caitlin Mejia presents for routine follow up of chronic med issues. She denies acute complaints or concerns.    Outpatient Encounter Medications as of 12/01/2021  Medication Sig   Accu-Chek Softclix Lancets lancets Use as instructed   atorvastatin (LIPITOR) 40 MG tablet Take 1 tablet (40 mg total) by mouth daily.   Blood Glucose Monitoring Suppl (ACCU-CHEK GUIDE) w/Device KIT USE AS INSTRUCTED TO CHECK BLOOD SUGAR ONCE DAILY.   cefadroxil (DURICEF) 500 MG capsule Take 1 capsule (500 mg total) by mouth 2 (two) times daily.   cyclobenzaprine (FLEXERIL) 5 MG tablet Take 1 tablet (5 mg total) by mouth at bedtime.   glucose blood (ACCU-CHEK GUIDE) test strip Use as instructed   metFORMIN (GLUCOPHAGE-XR) 500 MG 24 hr tablet TAKE 1 TABLET(500 MG) BY MOUTH TWICE DAILY WITH A MEAL   naproxen (NAPROSYN) 250 MG tablet Take 2 tablets (500 mg total) by mouth 2 (two) times daily with a meal.   oxyCODONE (ROXICODONE) 5 MG immediate release tablet Take 1 tablet (5 mg total) by mouth every 4 (four) hours as needed for severe pain.   Semaglutide, 1 MG/DOSE, 4 MG/3ML SOPN Inject 1 mg as directed once a week. Start once you finish the 0.5 mg weekly dose.   Semaglutide,0.25 or 0.5MG/DOS, (OZEMPIC, 0.25 OR 0.5 MG/DOSE,) 2 MG/3ML SOPN Inject 0.5 mg into the skin once a week.   No facility-administered encounter medications on file as of 12/01/2021.    Past Medical History:  Diagnosis Date   Diabetes mellitus without complication ([place])    Gall stones    Kidney stones     Past Surgical History:  Procedure Laterality Date   ORIF ANKLE FRACTURE Left 04/06/2020   Procedure: OPEN REDUCTION INTERNAL FIXATION (ORIF) DISTAL FIBULAR  FRACTURE;  Surgeon: Marchia Bond, MD;  Location: [place];  Service: Orthopedics;  Laterality: Left;   URETERAL STENT PLACEMENT     wisdom teeth      Family History  Problem Relation Age of Onset   Healthy Mother    Cerebral aneurysm Father    Diabetes Paternal Uncle     Social History   Socioeconomic History   Marital status: Single    Spouse name: Not on file   Number of children: Not on file   Years of education: Not on file   Highest education level: Not on file  Occupational History   Not on file  Tobacco Use   Smoking status: Never   Smokeless tobacco: Never  Substance and Sexual Activity   Alcohol use: Yes    Comment: Rarely   Drug use: No   Sexual activity: Yes    Birth control/protection: None  Other Topics Concern   Not on file  Social History Narrative   Not on file   Social Determinants of Health   Financial Resource Strain: Not on file  Food Insecurity: Not on file  Transportation Needs: Not on file  Physical Activity: Not on file  Stress: Not on file  Social Connections: Not on file  Intimate Partner Violence: Not on file    Review of Systems  All other systems reviewed and are negative.       Objective    There were  no vitals taken for this visit.  Physical Exam Vitals and nursing note reviewed.  Constitutional:      General: She is not in acute distress.    Appearance: She is obese.  Cardiovascular:     Rate and Rhythm: Normal rate and regular rhythm.  Pulmonary:     Effort: Pulmonary effort is normal.     Breath sounds: Normal breath sounds.  Abdominal:     Palpations: Abdomen is soft.     Tenderness: There is no abdominal tenderness.  Musculoskeletal:     Right lower leg: No edema.     Left lower leg: No edema.  Neurological:     General: No focal deficit present.     Mental Status: She is alert and oriented to person, place, and time.         Assessment & Plan:   1. Type 2 diabetes mellitus with other specified complication, with long-term current use  of insulin ([place]) Continue present management. A1c pending.  - Hemoglobin A1c  2. Hyperlipidemia, unspecified hyperlipidemia type Continue     No follow-ups on file.   Becky Sax, MD

## 2021-12-02 LAB — HEMOGLOBIN A1C
Est. average glucose Bld gHb Est-mCnc: 203 mg/dL
Hgb A1c MFr Bld: 8.7 % — ABNORMAL HIGH (ref 4.8–5.6)

## 2022-01-21 ENCOUNTER — Other Ambulatory Visit: Payer: Self-pay | Admitting: Family Medicine

## 2022-01-21 DIAGNOSIS — E1165 Type 2 diabetes mellitus with hyperglycemia: Secondary | ICD-10-CM

## 2022-01-23 NOTE — Telephone Encounter (Signed)
Requested Prescriptions  Pending Prescriptions Disp Refills   OZEMPIC, 0.25 OR 0.5 MG/DOSE, 2 MG/3ML SOPN [Pharmacy Med Name: OZEMPIC 0.25 OR 0.5MG /DOS1X2MG  3ML] 3 mL 0    Sig: INJECT 0.25 MG UNDER THE SKIN ONE DAY A WEEK FOR 4 WEEKS. IF TOLERABLE INCREASE TO 0.5 MG ONCE WEEKLY THEREAFTER     Endocrinology:  Diabetes - GLP-1 Receptor Agonists - semaglutide Failed - 01/21/2022 12:08 PM      Failed - HBA1C in normal range and within 180 days    HbA1c, POC (controlled diabetic range)  Date Value Ref Range Status  07/28/2021 9.9 (A) 0.0 - 7.0 % Final   Hgb A1c MFr Bld  Date Value Ref Range Status  12/01/2021 8.7 (H) 4.8 - 5.6 % Final    Comment:             Prediabetes: 5.7 - 6.4          Diabetes: >6.4          Glycemic control for adults with diabetes: <7.0          Passed - Cr in normal range and within 360 days    Creatinine, Ser  Date Value Ref Range Status  09/02/2021 0.91 0.57 - 1.00 mg/dL Final         Passed - Valid encounter within last 6 months    Recent Outpatient Visits           1 month ago Type 2 diabetes mellitus with other specified complication, with long-term current use of insulin [facility])   Primary Care at [facility], MD   2 months ago Uncontrolled type 2 diabetes mellitus with hyperglycemia [facility])   [place], Annie Main L, RPH-CPP   3 months ago Uncontrolled type 2 diabetes mellitus with hyperglycemia [facility])   [place], Annie Main L, RPH-CPP   4 months ago Uncontrolled type 2 diabetes mellitus with hyperglycemia [facility])   [place], Annie Main L, RPH-CPP   4 months ago Type 2 diabetes mellitus with other specified complication, with long-term current use of insulin [facility])   Primary Care at [facility], MD       Future Appointments             In 1 month Dorna Mai, MD Primary Care at [facility]

## 2022-03-02 ENCOUNTER — Ambulatory Visit: Payer: Medicaid Other | Admitting: Family Medicine

## 2022-04-07 ENCOUNTER — Other Ambulatory Visit: Payer: Self-pay | Admitting: Family Medicine

## 2022-04-07 ENCOUNTER — Other Ambulatory Visit: Payer: Self-pay

## 2022-04-07 MED FILL — Metformin HCl Tab ER 24HR 500 MG: ORAL | 90 days supply | Qty: 180 | Fill #0 | Status: CN

## 2022-04-10 ENCOUNTER — Other Ambulatory Visit: Payer: Self-pay

## 2022-04-14 ENCOUNTER — Other Ambulatory Visit: Payer: Self-pay

## 2022-05-08 ENCOUNTER — Ambulatory Visit: Payer: Medicaid Other | Attending: Family Medicine | Admitting: Pharmacist

## 2022-05-08 DIAGNOSIS — E1165 Type 2 diabetes mellitus with hyperglycemia: Secondary | ICD-10-CM | POA: Diagnosis not present

## 2022-05-08 MED ORDER — ACCU-CHEK SOFTCLIX LANCETS MISC: 2 refills | Status: DC

## 2022-05-08 MED ORDER — ACCU-CHEK GUIDE VI STRP: ORAL_STRIP | 2 refills | Status: DC

## 2022-05-08 MED ORDER — ATORVASTATIN CALCIUM 40 MG PO TABS: 40.0000 mg | ORAL_TABLET | Freq: Every day | ORAL | 3 refills | Status: DC

## 2022-05-08 MED ORDER — ACCU-CHEK GUIDE W/DEVICE KIT: PACK | 0 refills | Status: AC

## 2022-05-08 MED ORDER — OZEMPIC (0.25 OR 0.5 MG/DOSE) 2 MG/3ML ~~LOC~~ SOPN: PEN_INJECTOR | SUBCUTANEOUS | 1 refills | Status: DC

## 2022-05-08 MED ORDER — METFORMIN HCL ER 500 MG PO TB24: ORAL_TABLET | ORAL | 1 refills | Status: DC

## 2022-05-08 NOTE — Progress Notes (Signed)
    S:    No chief complaint on file.  37 y.o. female who presents for diabetes evaluation, education, and management.  Patient was referred by Primary Care Provider, Dr. Redmond Pulling, on 09/01/2021 and last seen by PCP on 12/01/2021. Last seen by pharmacy clinic on 11/14/2021.   Today, patient arrives in good spirits and presents without any assistance. States she has been off of medications for ~2 month stating she fell out of care. She has been symptomatic since stopping medications. Denies SE related to [place] and endorses this medication was working well for her when she was on it. She has had stress related to her living situation and family. She was offered to start a low dose of insulin but denied this as "her symptoms only started 1 week ago". A1c today pending.   Family/Social History:  -Never smoker -No alcohol use reported  -Father: cerebral aneurysm  Current diabetes medications include: metformin 500 mg BID (not taking), Ozempic 0.5 mg weekly (not taking)  Patient denies adherence to medications as prescribed as she has not been on these medications for ~2 months.   Insurance coverage: Medicaid   Patient denies hypoglycemic events, although she has not been checking.  Reported home fasting blood sugars: not checking, last checked ~1 week ago and her BG was ~300.   Patient reports nocturia (nighttime urination).  Patient denies neuropathy (nerve pain). Patient reports visual changes. Patient reports self foot exams.  O:  7 day average blood glucose: did not bring meter to visit.   Lab Results  Component Value Date   HGBA1C 8.7 (H) 12/01/2021   There were no vitals filed for this visit.  Lipid Panel     Component Value Date/Time   CHOL 135 09/02/2021 1506   TRIG 198 (H) 09/02/2021 1506   HDL 35 (L) 09/02/2021 1506   CHOLHDL 3.9 09/02/2021 1506   LDLCALC 67 09/02/2021 1506    Clinical Atherosclerotic Cardiovascular Disease (ASCVD): No  The ASCVD Risk score (Arnett  DK, et al., 2019) failed to calculate for the following reasons:   The 2019 ASCVD risk score is only valid for ages 43 to 28   A/P: Diabetes longstanding currently above goal based on symptoms. Patient is able to verbalize appropriate hypoglycemia management plan. Patient has not been on medications for ~2 months. -Patient declined low dose basal insulin. Instructed patient that after she resumes metformin and Ozempic, she will need to contact the clinic if BG remains >300 as we will need to add low dose insulin in the interim.   -Restarted GLP-1 Ozempic (semaglutide) 0.25 mg weekly. -Restarted metformin 500 mg BID. Denies GI side effects in the past. -Patient educated on purpose, proper use, and potential adverse effects of metformin and Ozempic.  -Refilled testing supplies. Patient denied to participate in [employer] CGM study.  -Extensively discussed pathophysiology of diabetes, recommended lifestyle interventions, dietary effects on blood sugar control.  -Counseled on s/sx of and management of hypoglycemia.  -Next A1c anticipated May 2024.   Written patient instructions provided. Patient verbalized understanding of treatment plan.  Total time in face to face counseling 30 minutes.    Follow-up:  Pharmacist 1 month. PCP: instructed patient to schedule.   Joseph Art, Pharm.D. PGY-2 Ambulatory Care Pharmacy Resident 05/08/2022 4:19 PM

## 2022-05-09 LAB — HEMOGLOBIN A1C
Est. average glucose Bld gHb Est-mCnc: 217 mg/dL
Hgb A1c MFr Bld: 9.2 % — ABNORMAL HIGH (ref 4.8–5.6)

## 2022-05-12 ENCOUNTER — Ambulatory Visit: Payer: Self-pay | Admitting: *Deleted

## 2022-05-12 NOTE — Telephone Encounter (Signed)
  Chief Complaint: elevated blood sugar 288  Symptoms: headache frequent urination. Stopped taking medications and has just restarted Ozempic back today and now taking metformin Frequency: na  Pertinent Negatives: Patient denies difficulty breathing no dizziness no weakness reported no fast heart rate noted no nausea or vomiting Disposition: []$ ED /[]$ Urgent Care (no appt availability in office) / []$ Appointment(In office/virtual)/ []$  [place] Virtual [facility]/ [x]$ Home Care/ []$ Refused Recommended Disposition /[]$ [place] [facility]/ []$  Follow-up with PCP Additional Notes:   Recommended to drink more water and brisk walk if possible . Give medications time to work due to just restarting. Recheck blood glucose after care advise and check 3 times a day. If blood sugar remains elevated go to UC/ED     Reason for Disposition  Blood glucose 70-240 mg/dL (3.9 -13.3 mmol/L)  Answer Assessment - Initial Assessment Questions 1. BLOOD GLUCOSE: "What is your blood glucose level?"      288 30 minutes ago  2. ONSET: "When did you check the blood glucose?"     30 minutes ago  3. USUAL RANGE: "What is your glucose level usually?" (e.g., usual fasting morning value, usual evening value)     na 4. KETONES: "Do you check for ketones (urine or blood test strips)?" If Yes, ask: "What does the test show now?"      na 5. TYPE 1 or 2:  "Do you know what type of diabetes you have?"  (e.g., Type 1, Type 2, Gestational; doesn't know)      na 6. INSULIN: "Do you take insulin?" "What type of insulin(s) do you use? What is the mode of delivery? (syringe, pen; injection or pump)?"      Just started on Ozempic again after stopping for a while. 7. DIABETES PILLS: "Do you take any pills for your diabetes?" If Yes, ask: "Have you missed taking any pills recently?"     Just started back taking metformin 8. OTHER SYMPTOMS: "Do you have any symptoms?" (e.g., fever, frequent urination, difficulty breathing, dizziness,  weakness, vomiting)     Frequent urination, headaches 9. PREGNANCY: "Is there any chance you are pregnant?" "When was your last menstrual period?"     na  Protocols used: Diabetes - High Blood Sugar-A-AH

## 2022-05-29 ENCOUNTER — Ambulatory Visit (INDEPENDENT_AMBULATORY_CARE_PROVIDER_SITE_OTHER): Payer: Medicaid Other | Admitting: Family Medicine

## 2022-05-29 VITALS — BP 126/77 | HR 80 | Temp 97.3°F | Resp 16 | Wt 220.0 lb

## 2022-05-29 DIAGNOSIS — E1165 Type 2 diabetes mellitus with hyperglycemia: Secondary | ICD-10-CM | POA: Diagnosis not present

## 2022-05-29 DIAGNOSIS — E785 Hyperlipidemia, unspecified: Secondary | ICD-10-CM | POA: Diagnosis not present

## 2022-05-29 LAB — POCT GLYCOSYLATED HEMOGLOBIN (HGB A1C): Hemoglobin A1C: 8.6 % — AB (ref 4.0–5.6)

## 2022-05-29 MED ORDER — ACCU-CHEK SOFTCLIX LANCETS MISC: 2 refills | Status: DC

## 2022-05-29 NOTE — Progress Notes (Unsigned)
Patient is here for their 6 month follow-up Patient has no concerns today Care gaps have been discussed with patient  

## 2022-06-01 ENCOUNTER — Encounter: Payer: Self-pay | Admitting: Family Medicine

## 2022-06-01 NOTE — Progress Notes (Signed)
Established Patient Office Visit  Subjective    Patient ID: Caitlin Mejia, female    DOB: 08-21-1985  Age: 37 y.o. MRN: [id number]  CC:  Chief Complaint  Patient presents with   Follow-up    HPI KEELEE Mejia presents for routine follow up of diabetes. Patient denies acute complaints.    Outpatient Encounter Medications as of 05/29/2022  Medication Sig   atorvastatin (LIPITOR) 40 MG tablet Take 1 tablet (40 mg total) by mouth daily.   Blood Glucose Monitoring Suppl (ACCU-CHEK GUIDE) w/Device KIT Use to check blood sugar three times daily. E11.65   glucose blood (ACCU-CHEK GUIDE) test strip Use to check blood sugar three times daily. E11.65   metFORMIN (GLUCOPHAGE-XR) 500 MG 24 hr tablet TAKE 1 TABLET(500 MG) BY MOUTH TWICE DAILY WITH A MEAL   Semaglutide,0.25 or 0.'5MG'$ /DOS, (OZEMPIC, 0.25 OR 0.5 MG/DOSE,) 2 MG/3ML SOPN INJECT 0.25 MG UNDER THE SKIN ONE DAY A WEEK FOR 4 WEEKS. IF TOLERABLE INCREASE TO 0.5 MG ONCE WEEKLY THEREAFTER   [DISCONTINUED] Accu-Chek Softclix Lancets lancets Use to check blood sugar three times daily. E11.65   Accu-Chek Softclix Lancets lancets Use to check blood sugar three times daily. E11.65   No facility-administered encounter medications on file as of 05/29/2022.    Past Medical History:  Diagnosis Date   Diabetes mellitus without complication ([place])    Gall stones    Kidney stones     Past Surgical History:  Procedure Laterality Date   ORIF ANKLE FRACTURE Left 04/06/2020   Procedure: OPEN REDUCTION INTERNAL FIXATION (ORIF) DISTAL FIBULAR  FRACTURE;  Surgeon: Marchia Bond, MD;  Location: [place];  Service: Orthopedics;  Laterality: Left;   URETERAL STENT PLACEMENT     wisdom teeth      Family History  Problem Relation Age of Onset   Healthy Mother    Cerebral aneurysm Father    Diabetes Paternal Uncle     Social History   Socioeconomic History   Marital status: Single    Spouse name: Not on file   Number  of children: Not on file   Years of education: Not on file   Highest education level: Not on file  Occupational History   Not on file  Tobacco Use   Smoking status: Never   Smokeless tobacco: Never  Substance and Sexual Activity   Alcohol use: Yes    Comment: Rarely   Drug use: No   Sexual activity: Yes    Birth control/protection: None  Other Topics Concern   Not on file  Social History Narrative   Not on file   Social Determinants of Health   Financial Resource Strain: Not on file  Food Insecurity: Not on file  Transportation Needs: Not on file  Physical Activity: Not on file  Stress: Not on file  Social Connections: Not on file  Intimate Partner Violence: Not on file    Review of Systems  All other systems reviewed and are negative.       Objective    BP 126/77   Pulse 80   Temp (!) 97.3 F (36.3 C) (Oral)   Resp 16   Wt 220 lb (99.8 kg)   SpO2 98%   BMI 40.24 kg/m   Physical Exam Vitals and nursing note reviewed.  Constitutional:      General: She is not in acute distress.    Appearance: She is obese.  Cardiovascular:     Rate and Rhythm: Normal rate and  regular rhythm.  Pulmonary:     Effort: Pulmonary effort is normal.     Breath sounds: Normal breath sounds.  Abdominal:     Palpations: Abdomen is soft.     Tenderness: There is no abdominal tenderness.  Musculoskeletal:     Right lower leg: No edema.     Left lower leg: No edema.  Neurological:     General: No focal deficit present.     Mental Status: She is alert and oriented to person, place, and time.         Assessment & Plan:   1. Uncontrolled type 2 diabetes mellitus with hyperglycemia (HCC) Improving A1c but not at goal. Continue and monitor - Accu-Chek Softclix Lancets lancets; Use to check blood sugar three times daily. E11.65  Dispense: 100 each; Refill: 2 - POCT glycosylated hemoglobin (Hb A1C) - [facility] DIABETES FOOT EXAM   2. Hyperlipidemia, unspecified hyperlipidemia  type Continue   Return in about 3 months (around 08/29/2022) for follow up.   Caitlin Sax, MD

## 2022-08-28 ENCOUNTER — Other Ambulatory Visit: Payer: Self-pay

## 2022-09-05 ENCOUNTER — Encounter: Payer: Self-pay | Admitting: Family Medicine

## 2022-09-05 ENCOUNTER — Ambulatory Visit (INDEPENDENT_AMBULATORY_CARE_PROVIDER_SITE_OTHER): Payer: Medicaid Other | Admitting: Family Medicine

## 2022-09-05 VITALS — BP 109/71 | HR 65 | Temp 97.7°F | Resp 16 | Wt 214.0 lb

## 2022-09-05 DIAGNOSIS — E1169 Type 2 diabetes mellitus with other specified complication: Secondary | ICD-10-CM

## 2022-09-05 DIAGNOSIS — E1165 Type 2 diabetes mellitus with hyperglycemia: Secondary | ICD-10-CM | POA: Diagnosis not present

## 2022-09-05 DIAGNOSIS — E785 Hyperlipidemia, unspecified: Secondary | ICD-10-CM

## 2022-09-05 DIAGNOSIS — Z794 Long term (current) use of insulin: Secondary | ICD-10-CM

## 2022-09-05 DIAGNOSIS — Z7984 Long term (current) use of oral hypoglycemic drugs: Secondary | ICD-10-CM | POA: Diagnosis not present

## 2022-09-05 DIAGNOSIS — Z7985 Long-term (current) use of injectable non-insulin antidiabetic drugs: Secondary | ICD-10-CM | POA: Diagnosis not present

## 2022-09-05 DIAGNOSIS — E669 Obesity, unspecified: Secondary | ICD-10-CM | POA: Diagnosis not present

## 2022-09-05 DIAGNOSIS — Z6839 Body mass index (BMI) 39.0-39.9, adult: Secondary | ICD-10-CM

## 2022-09-05 LAB — POCT GLYCOSYLATED HEMOGLOBIN (HGB A1C): Hemoglobin A1C: 7.3 % — AB (ref 4.0–5.6)

## 2022-09-05 MED ORDER — OZEMPIC (0.25 OR 0.5 MG/DOSE) 2 MG/3ML ~~LOC~~ SOPN
PEN_INJECTOR | SUBCUTANEOUS | 3 refills | Status: DC
Start: 2022-09-05 — End: 2023-05-23

## 2022-09-05 NOTE — Progress Notes (Unsigned)
Patient is here for their 3 month follow-up Patient has no concerns today Care gaps have been discussed with patient  

## 2022-09-06 ENCOUNTER — Encounter: Payer: Self-pay | Admitting: Family Medicine

## 2022-09-06 LAB — BASIC METABOLIC PANEL
BUN/Creatinine Ratio: 11 (ref 9–23)
BUN: 11 mg/dL (ref 6–20)
CO2: 22 mmol/L (ref 20–29)
Calcium: 9.2 mg/dL (ref 8.7–10.2)
Chloride: 104 mmol/L (ref 96–106)
Creatinine, Ser: 1.01 mg/dL — ABNORMAL HIGH (ref 0.57–1.00)
Glucose: 121 mg/dL — ABNORMAL HIGH (ref 70–99)
Potassium: 4.6 mmol/L (ref 3.5–5.2)
Sodium: 138 mmol/L (ref 134–144)
eGFR: 75 mL/min/{1.73_m2} (ref >59)

## 2022-09-06 LAB — MICROALBUMIN / CREATININE URINE RATIO
Creatinine, Urine: 216.6 mg/dL
Microalb/Creat Ratio: 24 mg/g creat (ref 0–29)
Microalbumin, Urine: 52.4 ug/mL

## 2022-09-06 NOTE — Progress Notes (Signed)
Established Patient Office Visit  Subjective    Patient ID: Caitlin Mejia, female    DOB: 1986-01-09  Age: 37 y.o. MRN: [id number]  CC: No chief complaint on file.   HPI [name] presents for routine follow up of chronic med issues. Patient denies acute complaints or concerns.    Outpatient Encounter Medications as of 09/05/2022  Medication Sig   Accu-Chek Softclix Lancets lancets Use to check blood sugar three times daily. E11.65   atorvastatin (LIPITOR) 40 MG tablet Take 1 tablet (40 mg total) by mouth daily.   Blood Glucose Monitoring Suppl (ACCU-CHEK GUIDE) w/Device KIT Use to check blood sugar three times daily. E11.65   glucose blood (ACCU-CHEK GUIDE) test strip Use to check blood sugar three times daily. E11.65   metFORMIN (GLUCOPHAGE-XR) 500 MG 24 hr tablet TAKE 1 TABLET(500 MG) BY MOUTH TWICE DAILY WITH A MEAL   [DISCONTINUED] Semaglutide,0.25 or 0.5MG /DOS, (OZEMPIC, 0.25 OR 0.5 MG/DOSE,) 2 MG/3ML SOPN INJECT 0.25 MG UNDER THE SKIN ONE DAY A WEEK FOR 4 WEEKS. IF TOLERABLE INCREASE TO 0.5 MG ONCE WEEKLY THEREAFTER   Semaglutide,0.25 or 0.5MG /DOS, (OZEMPIC, 0.25 OR 0.5 MG/DOSE,) 2 MG/3ML SOPN 0.5 MG ONCE WEEKLY   No facility-administered encounter medications on file as of 09/05/2022.    Past Medical History:  Diagnosis Date   Diabetes mellitus without complication (HCC)    Gall stones    Kidney stones     Past Surgical History:  Procedure Laterality Date   ORIF ANKLE FRACTURE Left 04/06/2020   Procedure: OPEN REDUCTION INTERNAL FIXATION (ORIF) DISTAL FIBULAR  FRACTURE;  Surgeon: Teryl Lucy, MD;  Location: [facility];  Service: Orthopedics;  Laterality: Left;   URETERAL STENT PLACEMENT     wisdom teeth      Family History  Problem Relation Age of Onset   Healthy Mother    Cerebral aneurysm Father    Diabetes Paternal Uncle     Social History   Socioeconomic History   Marital status: Single    Spouse name: Not on file    Number of children: Not on file   Years of education: Not on file   Highest education level: Not on file  Occupational History   Not on file  Tobacco Use   Smoking status: Never   Smokeless tobacco: Never  Substance and Sexual Activity   Alcohol use: Yes    Comment: Rarely   Drug use: No   Sexual activity: Yes    Birth control/protection: None  Other Topics Concern   Not on file  Social History Narrative   Not on file   Social Determinants of Health   Financial Resource Strain: Not on file  Food Insecurity: Not on file  Transportation Needs: Not on file  Physical Activity: Not on file  Stress: Not on file  Social Connections: Not on file  Intimate Partner Violence: Not on file    Review of Systems  All other systems reviewed and are negative.       Objective    BP 109/71   Pulse 65   Temp 97.7 F (36.5 C) (Oral)   Resp 16   Wt 214 lb (97.1 kg)   SpO2 94%   BMI 39.14 kg/m   Physical Exam Vitals and nursing note reviewed.  Constitutional:      General: She is not in acute distress.    Appearance: She is obese.  Cardiovascular:     Rate and Rhythm: Normal rate and regular  rhythm.  Pulmonary:     Effort: Pulmonary effort is normal.     Breath sounds: Normal breath sounds.  Abdominal:     Palpations: Abdomen is soft.     Tenderness: There is no abdominal tenderness.  Musculoskeletal:     Right lower leg: No edema.     Left lower leg: No edema.  Neurological:     General: No focal deficit present.     Mental Status: She is alert and oriented to person, place, and time.         Assessment & Plan:   1. Type 2 diabetes mellitus with other specified complication, with long-term current use of insulin (HCC) Improved A1c and nearing goal. Continue  - Basic Metabolic Panel  2. Hyperlipidemia, unspecified hyperlipidemia type Continue      Return in about 3 months (around 12/06/2022) for follow up.   Tommie Raymond, MD

## 2022-10-18 ENCOUNTER — Other Ambulatory Visit: Payer: Self-pay

## 2022-10-18 ENCOUNTER — Telehealth: Payer: Self-pay

## 2022-10-18 NOTE — Telephone Encounter (Signed)
A prior authorization request for Ozempic was submitted to insurance today via CoverMyMeds Key: [id number]

## 2022-12-05 ENCOUNTER — Encounter: Payer: Self-pay | Admitting: Family Medicine

## 2022-12-06 ENCOUNTER — Ambulatory Visit (INDEPENDENT_AMBULATORY_CARE_PROVIDER_SITE_OTHER): Payer: Medicaid Other | Admitting: Family Medicine

## 2022-12-06 ENCOUNTER — Ambulatory Visit: Payer: Medicaid Other | Admitting: Family Medicine

## 2022-12-06 VITALS — BP 113/76 | HR 74 | Temp 98.1°F | Resp 16 | Wt 205.0 lb

## 2022-12-06 DIAGNOSIS — E1169 Type 2 diabetes mellitus with other specified complication: Secondary | ICD-10-CM | POA: Diagnosis not present

## 2022-12-06 DIAGNOSIS — Z794 Long term (current) use of insulin: Secondary | ICD-10-CM | POA: Diagnosis not present

## 2022-12-06 DIAGNOSIS — E785 Hyperlipidemia, unspecified: Secondary | ICD-10-CM | POA: Diagnosis not present

## 2022-12-06 DIAGNOSIS — Z6841 Body Mass Index (BMI) 40.0 and over, adult: Secondary | ICD-10-CM

## 2022-12-06 LAB — POCT GLYCOSYLATED HEMOGLOBIN (HGB A1C): Hemoglobin A1C: 6.6 % — AB (ref 4.0–5.6)

## 2022-12-07 LAB — LIPID PANEL
Chol/HDL Ratio: 5.2 ratio — ABNORMAL HIGH (ref 0.0–4.4)
Cholesterol, Total: 203 mg/dL — ABNORMAL HIGH (ref 100–199)
HDL: 39 mg/dL — ABNORMAL LOW (ref >39)
LDL Chol Calc (NIH): 140 mg/dL — ABNORMAL HIGH (ref 0–99)
Triglycerides: 133 mg/dL (ref 0–149)
VLDL Cholesterol Cal: 24 mg/dL (ref 5–40)

## 2022-12-07 LAB — CMP14+EGFR
ALT: 18 IU/L (ref 0–32)
AST: 14 IU/L (ref 0–40)
Albumin: 4.4 g/dL (ref 3.9–4.9)
Alkaline Phosphatase: 64 IU/L (ref 44–121)
BUN/Creatinine Ratio: 10 (ref 9–23)
BUN: 9 mg/dL (ref 6–20)
Bilirubin Total: 0.3 mg/dL (ref 0.0–1.2)
CO2: 22 mmol/L (ref 20–29)
Calcium: 9.3 mg/dL (ref 8.7–10.2)
Chloride: 104 mmol/L (ref 96–106)
Creatinine, Ser: 0.88 mg/dL (ref 0.57–1.00)
Globulin, Total: 2.4 g/dL (ref 1.5–4.5)
Glucose: 120 mg/dL — ABNORMAL HIGH (ref 70–99)
Potassium: 4.4 mmol/L (ref 3.5–5.2)
Sodium: 140 mmol/L (ref 134–144)
Total Protein: 6.8 g/dL (ref 6.0–8.5)
eGFR: 88 mL/min/{1.73_m2} (ref >59)

## 2022-12-11 ENCOUNTER — Encounter: Payer: Self-pay | Admitting: Family Medicine

## 2022-12-11 NOTE — Progress Notes (Signed)
Established Patient Office Visit  Subjective    Patient ID: Caitlin Mejia, female    DOB: 16-Oct-1985  Age: 37 y.o. MRN: [id number]  CC:  Chief Complaint  Patient presents with   Pain Management    HPI TANGINA LORDI presents for follow up of chronic med issues. Patient denies acute complaints or concerns.   Outpatient Encounter Medications as of 12/06/2022  Medication Sig   Accu-Chek Softclix Lancets lancets Use to check blood sugar three times daily. E11.65   atorvastatin (LIPITOR) 40 MG tablet Take 1 tablet (40 mg total) by mouth daily.   Blood Glucose Monitoring Suppl (ACCU-CHEK GUIDE) w/Device KIT Use to check blood sugar three times daily. E11.65   glucose blood (ACCU-CHEK GUIDE) test strip Use to check blood sugar three times daily. E11.65   metFORMIN (GLUCOPHAGE-XR) 500 MG 24 hr tablet TAKE 1 TABLET(500 MG) BY MOUTH TWICE DAILY WITH A MEAL   Semaglutide,0.25 or 0.5MG /DOS, (OZEMPIC, 0.25 OR 0.5 MG/DOSE,) 2 MG/3ML SOPN 0.5 MG ONCE WEEKLY   No facility-administered encounter medications on file as of 12/06/2022.    Past Medical History:  Diagnosis Date   Diabetes mellitus without complication (HCC)    Gall stones    Kidney stones     Past Surgical History:  Procedure Laterality Date   ORIF ANKLE FRACTURE Left 04/06/2020   Procedure: OPEN REDUCTION INTERNAL FIXATION (ORIF) DISTAL FIBULAR  FRACTURE;  Surgeon: Teryl Lucy, MD;  Location: [facility];  Service: Orthopedics;  Laterality: Left;   URETERAL STENT PLACEMENT     wisdom teeth      Family History  Problem Relation Age of Onset   Healthy Mother    Cerebral aneurysm Father    Diabetes Paternal Uncle     Social History   Socioeconomic History   Marital status: Single    Spouse name: Not on file   Number of children: Not on file   Years of education: Not on file   Highest education level: Not on file  Occupational History   Not on file  Tobacco Use   Smoking status: Never    Smokeless tobacco: Never  Substance and Sexual Activity   Alcohol use: Yes    Comment: Rarely   Drug use: No   Sexual activity: Yes    Birth control/protection: None  Other Topics Concern   Not on file  Social History Narrative   Not on file   Social Determinants of Health   Financial Resource Strain: Low Risk  (12/08/2022)   Overall Financial Resource Strain (CARDIA)    Difficulty of Paying Living Expenses: Not hard at all  Food Insecurity: No Food Insecurity (12/08/2022)   Hunger Vital Sign    Worried About Running Out of Food in the Last Year: Never true    Ran Out of Food in the Last Year: Never true  Transportation Needs: No Transportation Needs (12/08/2022)   PRAPARE - Administrator, Civil Service (Medical): No    Lack of Transportation (Non-Medical): No  Physical Activity: Inactive (12/08/2022)   Exercise Vital Sign    Days of Exercise per Week: 0 days    Minutes of Exercise per Session: 0 min  Stress: No Stress Concern Present (12/08/2022)   [name] of Occupational Health - Occupational Stress Questionnaire    Feeling of Stress : Not at all  Social Connections: Moderately Isolated (12/08/2022)   Social Connection and Isolation Panel [NHANES]    Frequency of Communication with Friends  and Family: More than three times a week    Frequency of Social Gatherings with Friends and Family: More than three times a week    Attends Religious Services: Never    Database administrator or Organizations: No    Attends Banker Meetings: Never    Marital Status: Married  Catering manager Violence: Not At Risk (12/08/2022)   Humiliation, Afraid, Rape, and Kick questionnaire    Fear of Current or Ex-Partner: No    Emotionally Abused: No    Physically Abused: No    Sexually Abused: No    Review of Systems  All other systems reviewed and are negative.       Objective    BP 113/76   Pulse 74   Temp 98.1 F (36.7 C) (Oral)   Resp 16   Wt 205  lb (93 kg)   SpO2 98%   BMI 37.49 kg/m   Physical Exam Vitals and nursing note reviewed.  Constitutional:      General: She is not in acute distress.    Appearance: She is obese.  Cardiovascular:     Rate and Rhythm: Normal rate and regular rhythm.  Pulmonary:     Effort: Pulmonary effort is normal.     Breath sounds: Normal breath sounds.  Abdominal:     Palpations: Abdomen is soft.     Tenderness: There is no abdominal tenderness.  Musculoskeletal:     Right lower leg: No edema.     Left lower leg: No edema.  Neurological:     General: No focal deficit present.     Mental Status: She is alert and oriented to person, place, and time.         Assessment & Plan:  1. Type 2 diabetes mellitus with other specified complication, with long-term current use of insulin (HCC) Improved A1c and now at goal. Continue  - POCT glycosylated hemoglobin (Hb A1C) - Lipid Panel - CMP14+EGFR  2. Hyperlipidemia, unspecified hyperlipidemia type Continue  - Lipid Panel - CMP14+EGFR  3. Class 3 severe obesity due to excess calories with serious comorbidity and body mass index (BMI) of 40.0 to 44.9 in adult (HCC)   4. Encounter for long-term (current) use of insulin (HCC)   Return in about 6 months (around 06/05/2023) for follow up, chronic med issues.   Tommie Raymond, MD

## 2023-05-23 ENCOUNTER — Other Ambulatory Visit: Payer: Self-pay | Admitting: Family Medicine

## 2023-05-23 DIAGNOSIS — E1165 Type 2 diabetes mellitus with hyperglycemia: Secondary | ICD-10-CM

## 2023-07-19 ENCOUNTER — Encounter: Payer: Self-pay | Admitting: Family Medicine

## 2023-07-19 ENCOUNTER — Ambulatory Visit: Admitting: Family Medicine

## 2023-07-19 VITALS — BP 116/80 | HR 65 | Wt 218.2 lb

## 2023-07-19 DIAGNOSIS — R109 Unspecified abdominal pain: Secondary | ICD-10-CM

## 2023-07-19 DIAGNOSIS — Z794 Long term (current) use of insulin: Secondary | ICD-10-CM | POA: Diagnosis not present

## 2023-07-19 DIAGNOSIS — E1169 Type 2 diabetes mellitus with other specified complication: Secondary | ICD-10-CM

## 2023-07-19 DIAGNOSIS — E785 Hyperlipidemia, unspecified: Secondary | ICD-10-CM

## 2023-07-19 DIAGNOSIS — E66813 Obesity, class 3: Secondary | ICD-10-CM

## 2023-07-19 DIAGNOSIS — Z6841 Body Mass Index (BMI) 40.0 and over, adult: Secondary | ICD-10-CM

## 2023-07-19 LAB — POCT GLYCOSYLATED HEMOGLOBIN (HGB A1C): HbA1c, POC (controlled diabetic range): 9.4 % — AB (ref 0.0–7.0)

## 2023-07-19 LAB — POCT URINALYSIS DIP (CLINITEK)
Bilirubin, UA: NEGATIVE
Glucose, UA: NEGATIVE mg/dL
Ketones, POC UA: NEGATIVE mg/dL
Nitrite, UA: NEGATIVE
Spec Grav, UA: 1.025 (ref 1.010–1.025)
Urobilinogen, UA: 0.2 U/dL
pH, UA: 6 (ref 5.0–8.0)

## 2023-07-19 MED ORDER — FLUCONAZOLE 150 MG PO TABS: 150.0000 mg | ORAL_TABLET | Freq: Once | ORAL | 0 refills | Status: AC

## 2023-07-19 MED ORDER — NITROFURANTOIN MONOHYD MACRO 100 MG PO CAPS: 100.0000 mg | ORAL_CAPSULE | Freq: Two times a day (BID) | ORAL | 0 refills | Status: DC

## 2023-07-19 NOTE — Progress Notes (Unsigned)
 Established Patient Office Visit  Subjective    Patient ID: Caitlin Mejia, female    DOB: 14-Aug-1985  Age: 38 y.o. MRN: [id number]  CC:  Chief Complaint  Patient presents with   Medication Refill   Flank Pain    For few months mor so in morning, states it feels like a dull pain     HPI Caitlin Mejia presents for routine follow up of chronic med issues including diabetes. Patient also reports some right flank pain for the last couple of days with some dysuria. Denies fever/chills.   Outpatient Encounter Medications as of 07/19/2023  Medication Sig   Accu-Chek Softclix Lancets lancets Use to check blood sugar three times daily. E11.65   atorvastatin  (LIPITOR) 40 MG tablet Take 1 tablet (40 mg total) by mouth daily. Please make PCP appointment for additional refills.   Blood Glucose Monitoring Suppl (ACCU-CHEK GUIDE) w/Device KIT Use to check blood sugar three times daily. E11.65   [EXPIRED] fluconazole  (DIFLUCAN ) 150 MG tablet Take 1 tablet (150 mg total) by mouth once for 1 dose.   glucose blood (ACCU-CHEK GUIDE) test strip Use to check blood sugar three times daily. E11.65   metFORMIN  (GLUCOPHAGE -XR) 500 MG 24 hr tablet TAKE 1 TABLET(500 MG) BY MOUTH TWICE DAILY WITH A MEAL   nitrofurantoin , macrocrystal-monohydrate, (MACROBID ) 100 MG capsule Take 1 capsule (100 mg total) by mouth 2 (two) times daily.   Semaglutide ,0.25 or 0.5MG /DOS, (OZEMPIC , 0.25 OR 0.5 MG/DOSE,) 2 MG/3ML SOPN INJECT 0.25 MG INTO SKIN WEEKLY FOR 4 WEEKS IF TOLERATED INCREASE TO 0.5 MG WEEKLY (Patient not taking: Reported on 07/19/2023)   No facility-administered encounter medications on file as of 07/19/2023.    Past Medical History:  Diagnosis Date   Diabetes mellitus without complication (HCC)    Gall stones    Kidney stones     Past Surgical History:  Procedure Laterality Date   ORIF ANKLE FRACTURE Left 04/06/2020   Procedure: OPEN REDUCTION INTERNAL FIXATION (ORIF) DISTAL FIBULAR  FRACTURE;   Surgeon: Osa Blase, MD;  Location: [facility];  Service: Orthopedics;  Laterality: Left;   URETERAL STENT PLACEMENT     wisdom teeth      Family History  Problem Relation Age of Onset   Healthy Mother    Cerebral aneurysm Father    Diabetes Paternal Uncle     Social History   Socioeconomic History   Marital status: Single    Spouse name: Not on file   Number of children: Not on file   Years of education: Not on file   Highest education level: Not on file  Occupational History   Not on file  Tobacco Use   Smoking status: Never   Smokeless tobacco: Never  Substance and Sexual Activity   Alcohol use: Yes    Comment: Rarely   Drug use: No   Sexual activity: Yes    Birth control/protection: None  Other Topics Concern   Not on file  Social History Narrative   Not on file   Social Drivers of Health   Financial Resource Strain: Low Risk  (12/08/2022)   Overall Financial Resource Strain (CARDIA)    Difficulty of Paying Living Expenses: Not hard at all  Food Insecurity: No Food Insecurity (12/08/2022)   Hunger Vital Sign    Worried About Running Out of Food in the Last Year: Never true    Ran Out of Food in the Last Year: Never true  Transportation Needs: No Transportation Needs (  12/08/2022)   PRAPARE - Administrator, Civil Service (Medical): No    Lack of Transportation (Non-Medical): No  Physical Activity: Inactive (12/08/2022)   Exercise Vital Sign    Days of Exercise per Week: 0 days    Minutes of Exercise per Session: 0 min  Stress: No Stress Concern Present (12/08/2022)   [name] of Occupational Health - Occupational Stress Questionnaire    Feeling of Stress : Not at all  Social Connections: Moderately Isolated (12/08/2022)   Social Connection and Isolation Panel [NHANES]    Frequency of Communication with Friends and Family: More than three times a week    Frequency of Social Gatherings with Friends and Family: More than  three times a week    Attends Religious Services: Never    Database administrator or Organizations: No    Attends Banker Meetings: Never    Marital Status: Married  Catering manager Violence: Not At Risk (12/08/2022)   Humiliation, Afraid, Rape, and Kick questionnaire    Fear of Current or Ex-Partner: No    Emotionally Abused: No    Physically Abused: No    Sexually Abused: No    Review of Systems  All other systems reviewed and are negative.       Objective    BP 116/80 (BP Location: Right Arm, Patient Position: Sitting, Cuff Size: Large)   Pulse 65   Wt 218 lb 3.2 oz (99 kg)   SpO2 96%   BMI 39.91 kg/m   Physical Exam Vitals and nursing note reviewed.  Constitutional:      General: She is not in acute distress.    Appearance: She is obese.  Cardiovascular:     Rate and Rhythm: Normal rate and regular rhythm.  Pulmonary:     Effort: Pulmonary effort is normal.     Breath sounds: Normal breath sounds.  Abdominal:     Palpations: Abdomen is soft.     Tenderness: There is no abdominal tenderness. There is right CVA tenderness.  Musculoskeletal:     Right lower leg: No edema.     Left lower leg: No edema.  Neurological:     General: No focal deficit present.     Mental Status: She is alert and oriented to person, place, and time.         Assessment & Plan:  1. Type 2 diabetes mellitus with other specified complication, with long-term current use of insulin  (HCC) (Primary) Elevated A1c. Schedule with [facility] for med management.  - POCT glycosylated hemoglobin (Hb A1C)  2. Acute right flank pain Macrobid  prescribed.  - POCT URINALYSIS DIP (CLINITEK)  3. Hyperlipidemia, unspecified hyperlipidemia type Continue   4. Class 3 severe obesity due to excess calories with serious comorbidity and body mass index (BMI) of 40.0 to 44.9 in adult      No follow-ups on file.   Arlo Lama, MD

## 2023-07-24 ENCOUNTER — Encounter: Payer: Self-pay | Admitting: Family Medicine

## 2023-08-07 ENCOUNTER — Encounter: Payer: Self-pay | Admitting: Pharmacist

## 2023-08-07 ENCOUNTER — Ambulatory Visit: Attending: Family Medicine | Admitting: Pharmacist

## 2023-08-07 DIAGNOSIS — E1165 Type 2 diabetes mellitus with hyperglycemia: Secondary | ICD-10-CM

## 2023-08-07 DIAGNOSIS — Z7985 Long-term (current) use of injectable non-insulin antidiabetic drugs: Secondary | ICD-10-CM | POA: Diagnosis not present

## 2023-08-07 DIAGNOSIS — Z7984 Long term (current) use of oral hypoglycemic drugs: Secondary | ICD-10-CM

## 2023-08-07 MED ORDER — NITROFURANTOIN MONOHYD MACRO 100 MG PO CAPS: 100.0000 mg | ORAL_CAPSULE | Freq: Two times a day (BID) | ORAL | 0 refills | Status: DC

## 2023-08-07 MED ORDER — OZEMPIC (0.25 OR 0.5 MG/DOSE) 2 MG/3ML ~~LOC~~ SOPN: 0.5000 mg | PEN_INJECTOR | SUBCUTANEOUS | 0 refills | Status: DC

## 2023-08-07 MED ORDER — ACCU-CHEK SOFTCLIX LANCETS MISC: 2 refills | Status: AC

## 2023-08-07 MED ORDER — SEMAGLUTIDE (1 MG/DOSE) 4 MG/3ML ~~LOC~~ SOPN: 1.0000 mg | PEN_INJECTOR | SUBCUTANEOUS | 2 refills | Status: DC

## 2023-08-07 MED ORDER — ACCU-CHEK GUIDE TEST VI STRP: ORAL_STRIP | 6 refills | Status: AC

## 2023-08-07 MED ORDER — METFORMIN HCL ER 500 MG PO TB24: ORAL_TABLET | ORAL | 1 refills | Status: DC

## 2023-08-07 NOTE — Progress Notes (Signed)
 S:    No chief complaint on file.  38 y.o. female who presents for diabetes evaluation, education, and management.   Last seen by me on 05/08/2022 where A1c is 9.2%. Patient was restarted on metformin  500 mg BID and Ozempic  0.25 mg weekly. Subsequently, she was seen by her PCP several times. On 12/06/2022, A1c was 6.6%. Unfortunately, this was her most recent visit prior to reestablishing with Dr. Elvan Hamel earlier this month. During her visit on 07/19/2023 with Dr. Elvan Hamel, she was symptomatic endorsed symptoms of cystitis. She was prescribed Macrobid  100 mg BID and fluconazole  for UTI and VVC due to A1c being 9.4% at this visit.   Today, patient reports being adherent to her anti-diabetic regimen. She reports that she has been taking Ozempic  0.25 mg weekly for the past 5 weeks and denies GI effects. She reports her metformin  is expired but she is still taking it as 500 mg BID. Unfortunately, she never picked up the antibiotic and still endorses dull right sided flank pain. She denies dysuria/polyuria/nocturia. Regarding hyperglycemic, she denies any changes in neuropathy (nerve pain). Denies any visual changes.  Current diabetes medications include: metformin  500 mg ER BID and Ozempic  0.5 mg weekly (reports taking 0.25 mg for the past 5 weeks)   Insurance coverage: Medicaid   Family/Social History:  -Never smoker -No alcohol use reported  -Father: cerebral aneurysm  Patient denies hypoglycemic events  Reported home fasting blood sugars: not checking, does not have lancets and test strips are expired.   O:   Lab Results  Component Value Date   HGBA1C 9.4 (A) 07/19/2023   There were no vitals filed for this visit.  Lipid Panel     Component Value Date/Time   CHOL 203 (H) 12/06/2022 1425   TRIG 133 12/06/2022 1425   HDL 39 (L) 12/06/2022 1425   CHOLHDL 5.2 (H) 12/06/2022 1425   LDLCALC 140 (H) 12/06/2022 1425    Clinical Atherosclerotic Cardiovascular Disease (ASCVD): No  The  ASCVD Risk score (Arnett DK, et al., 2019) failed to calculate for the following reasons:   The 2019 ASCVD risk score is only valid for ages 23 to 62   A/P: Diabetes longstanding currently above goal based on recent A1c. Patient reports symptoms related to pyelonephritis and UTI that stemmed from uncontrolled blood sugars. She never picked up her Macrobid  and I have reordered this for her today. Patient is able to verbalize appropriate hypoglycemia management plan but is not currently hypoglycemic. Patient has been having difficulty with medication adherence but is amenable to getting back on track. We will have her continue to titrate Ozempic  as tolerated.  -Increase Ozempic  to 0.5 mg weekly for 4 weeks then follow with 1 mg Ozempic  weekly thereafter.   -Continue metformin  500 mg ER BID  -Start macrobid  100 mg BID for 5 days   - Rx for lancets and test strips for glucometer sent to patient's pharmacy.   -Patient educated on purpose, proper use, and potential adverse effects of metformin  and Ozempic .  -Extensively discussed pathophysiology of diabetes, recommended lifestyle interventions, dietary effects on blood sugar control.  -Counseled on s/sx of and management of hypoglycemia.  -Next A1c anticipated 10/2023  Written patient instructions provided. Patient verbalized understanding of treatment plan.  Total time in face to face counseling 30 minutes.    Follow-up:  Pharmacist: 09/10/2023  Georges Kings M.S. PharmD Candidate Class of 2026 [employer]   Marene Shape, PharmD, [place], CPP Clinical Pharmacist [facility] [phone]

## 2023-08-08 ENCOUNTER — Other Ambulatory Visit: Payer: Self-pay

## 2023-09-04 ENCOUNTER — Other Ambulatory Visit: Payer: Self-pay | Admitting: Family Medicine

## 2023-09-10 ENCOUNTER — Ambulatory Visit: Admitting: Pharmacist

## 2023-10-04 ENCOUNTER — Telehealth: Payer: Self-pay | Admitting: Family Medicine

## 2023-10-04 NOTE — Telephone Encounter (Signed)
 Called patient to confirm appointment with Clinical Pharmacist on 10/08/2023 at 2:00 pm. but was unable to leave voicemail.

## 2023-10-08 ENCOUNTER — Ambulatory Visit: Attending: Family Medicine | Admitting: Pharmacist

## 2023-10-08 ENCOUNTER — Encounter: Payer: Self-pay | Admitting: Pharmacist

## 2023-10-08 DIAGNOSIS — Z7984 Long term (current) use of oral hypoglycemic drugs: Secondary | ICD-10-CM

## 2023-10-08 DIAGNOSIS — Z7985 Long-term (current) use of injectable non-insulin antidiabetic drugs: Secondary | ICD-10-CM

## 2023-10-08 DIAGNOSIS — E1165 Type 2 diabetes mellitus with hyperglycemia: Secondary | ICD-10-CM

## 2023-10-08 MED ORDER — OZEMPIC (0.25 OR 0.5 MG/DOSE) 2 MG/3ML ~~LOC~~ SOPN
0.5000 mg | PEN_INJECTOR | SUBCUTANEOUS | 0 refills | Status: DC
Start: 2023-10-08 — End: 2023-11-09

## 2023-10-08 MED ORDER — CEFADROXIL 500 MG PO CAPS: 500.0000 mg | ORAL_CAPSULE | Freq: Two times a day (BID) | ORAL | 0 refills | Status: DC

## 2023-10-08 NOTE — Progress Notes (Signed)
    S:    No chief complaint on file.  38 y.o. female who presents for diabetes evaluation, education, and management. She was last seen and referred by her PCP, Dr. Tanda, on 07/19/23.   Last seen by me on 08/07/23. We restarted Ozempic  at that visit. Of note, I also had her pick up and start the nitrofurantoin  and fluconazole  that was prescribed 07/19/2023 by Dr. Tanda.  Today, patient reports that she took Ozempic  for ~1 month but has been out for the last 3 weeks. She also admits to not taking metformin . Denies any GI side effects when taking consistently.  She has not been checking her blood sugars. Of note, she took nitrofurantoin  for a couple of days after her last appointment with me but stopped due to nausea. Still endorses dull right sided flank pain. Regarding hyperglycemia, she denies any changes in neuropathy (nerve pain). Denies any visual changes.  Current diabetes medications include: metformin  500 mg ER BID (not taking), Ozempic  0.5 mg weekly (not taking)   Insurance coverage: Medicaid   Family/Social History:  -Never smoker -No alcohol use reported  -Father: cerebral aneurysm  Patient denies hypoglycemic events  Reported home fasting blood sugars: not checking  O:   Lab Results  Component Value Date   HGBA1C 9.4 (A) 07/19/2023   There were no vitals filed for this visit.  Lipid Panel     Component Value Date/Time   CHOL 203 (H) 12/06/2022 1425   TRIG 133 12/06/2022 1425   HDL 39 (L) 12/06/2022 1425   CHOLHDL 5.2 (H) 12/06/2022 1425   LDLCALC 140 (H) 12/06/2022 1425    Clinical Atherosclerotic Cardiovascular Disease (ASCVD): No  The ASCVD Risk score (Arnett DK, et al., 2019) failed to calculate for the following reasons:   The 2019 ASCVD risk score is only valid for ages 26 to 70   A/P: Diabetes longstanding currently above goal based on most recent A1c. Patient reports symptoms related to pyelonephritis and UTI that stemmed from uncontrolled blood  sugars. She stopped Macrobid  after a couple of days d/t nausea. She took cefadroxil  in the past for similar symptoms. I will refill this for her today. Patient is able to verbalize appropriate hypoglycemia management plan but is not currently hypoglycemic. Patient has been having difficulty with medication adherence but is amenable to getting back on track. We will have her resume Ozempic  and metfromin.  -Resume Ozempic  to 0.5 mg weekly for 4 weeks then follow with 1 mg Ozempic  weekly thereafter.   -Continue metformin  500 mg ER BID  -Start cefadroxil  500 mg BID for 5 days   -Patient educated on purpose, proper use, and potential adverse effects of metformin  and Ozempic .  -Extensively discussed pathophysiology of diabetes, recommended lifestyle interventions, dietary effects on blood sugar control.  -Counseled on s/sx of and management of hypoglycemia.  -Next A1c anticipated 10/2023  Written patient instructions provided. Patient verbalized understanding of treatment plan.  Total time in face to face counseling 30 minutes.    Follow-up:  Pharmacist: in 1 month  Herlene Fleeta Morris, PharmD, [place], CPP Clinical Pharmacist [facility] & [facility] [phone]

## 2023-11-09 ENCOUNTER — Ambulatory Visit: Attending: Family Medicine | Admitting: Pharmacist

## 2023-11-09 ENCOUNTER — Encounter: Payer: Self-pay | Admitting: Pharmacist

## 2023-11-09 DIAGNOSIS — E1165 Type 2 diabetes mellitus with hyperglycemia: Secondary | ICD-10-CM | POA: Diagnosis not present

## 2023-11-09 DIAGNOSIS — Z7984 Long term (current) use of oral hypoglycemic drugs: Secondary | ICD-10-CM

## 2023-11-09 DIAGNOSIS — Z7985 Long-term (current) use of injectable non-insulin antidiabetic drugs: Secondary | ICD-10-CM

## 2023-11-09 LAB — POCT GLYCOSYLATED HEMOGLOBIN (HGB A1C): HbA1c, POC (controlled diabetic range): 7 % (ref 0.0–7.0)

## 2023-11-09 MED ORDER — OZEMPIC (0.25 OR 0.5 MG/DOSE) 2 MG/3ML ~~LOC~~ SOPN
0.5000 mg | PEN_INJECTOR | SUBCUTANEOUS | 1 refills | Status: DC
Start: 2023-11-09 — End: 2023-12-21

## 2023-11-09 NOTE — Progress Notes (Signed)
    S:    No chief complaint on file.  38 y.o. female who presents for diabetes evaluation, education, and management. She was last seen and referred by her PCP, Dr. Tanda, on 07/19/23.   Last seen by me on 10/08/2023. We restarted Ozempic  at that visit. Of note, I also had her pick up and take antibiotics for symptoms suggestive of cystitis.   Today, patient reports that she is taking both the Ozempic  and metformin  as prescribed.  She has not been checking her blood sugars. Of note, she took the antibiotics. Denies any dysuria, hematuria. Deines any urinary hestiancy or urgency. Continues to endorse right sided flank and lower back pain. Regarding hyperglycemia, she denies any changes in neuropathy (nerve pain). Denies any visual changes.  Current diabetes medications include: metformin  500 mg XR BID, Ozempic  0.5 mg weekly  Insurance coverage: Medicaid   Family/Social History:  -Never smoker -No alcohol use reported  -Father: cerebral aneurysm  Patient denies hypoglycemic events  Reported home fasting blood sugars: none given  O:   Lab Results  Component Value Date   HGBA1C 7.0 11/09/2023   There were no vitals filed for this visit.  Lipid Panel     Component Value Date/Time   CHOL 203 (H) 12/06/2022 1425   TRIG 133 12/06/2022 1425   HDL 39 (L) 12/06/2022 1425   CHOLHDL 5.2 (H) 12/06/2022 1425   LDLCALC 140 (H) 12/06/2022 1425    Clinical Atherosclerotic Cardiovascular Disease (ASCVD): No  The ASCVD Risk score (Arnett DK, et al., 2019) failed to calculate for the following reasons:   The 2019 ASCVD risk score is only valid for ages 17 to 46   A/P: Diabetes longstanding currently at goal based on A1c today. Commended her for this! Patient is asymptomatic from a hyper- and hypoglycemia standpoint. Patient is able to verbalize appropriate hypoglycemia management plan but is not currently hypoglycemic. Her adherence has improved and I commended her for this. Recommend she  continue Ozempic  0.5 mg weekly for now and we will increase her to 1 mg weekly as tolerated at follow-up. -Continue Ozempic  0.5 mg weekly for now. We will follow with 1 mg Ozempic  weekly thereafter if tolerated.   -Continue metformin  500 mg ER BID  -Patient educated on purpose, proper use, and potential adverse effects of metformin  and Ozempic .  -Extensively discussed pathophysiology of diabetes, recommended lifestyle interventions, dietary effects on blood sugar control.  -Counseled on s/sx of and management of hypoglycemia.  -Next A1c anticipated 01/2024  Written patient instructions provided. Patient verbalized understanding of treatment plan.  Total time in face to face counseling 30 minutes.    Follow-up:  Pharmacist: in 1 month PCP: two months  Herlene Fleeta Morris, PharmD, [place], CPP Clinical Pharmacist [facility] & [facility] [phone]

## 2023-11-10 LAB — CMP14+EGFR
ALT: 24 IU/L (ref 0–32)
AST: 15 IU/L (ref 0–40)
Albumin: 4.3 g/dL (ref 3.9–4.9)
Alkaline Phosphatase: 68 IU/L (ref 44–121)
BUN/Creatinine Ratio: 12 (ref 9–23)
BUN: 13 mg/dL (ref 6–20)
Bilirubin Total: 0.3 mg/dL (ref 0.0–1.2)
CO2: 22 mmol/L (ref 20–29)
Calcium: 9.5 mg/dL (ref 8.7–10.2)
Chloride: 103 mmol/L (ref 96–106)
Creatinine, Ser: 1.13 mg/dL — ABNORMAL HIGH (ref 0.57–1.00)
Globulin, Total: 2.5 g/dL (ref 1.5–4.5)
Glucose: 135 mg/dL — ABNORMAL HIGH (ref 70–99)
Potassium: 4.7 mmol/L (ref 3.5–5.2)
Sodium: 139 mmol/L (ref 134–144)
Total Protein: 6.8 g/dL (ref 6.0–8.5)
eGFR: 65 mL/min/1.73 (ref >59)

## 2023-11-10 LAB — LIPID PANEL
Chol/HDL Ratio: 4.8 ratio — ABNORMAL HIGH (ref 0.0–4.4)
Cholesterol, Total: 186 mg/dL (ref 100–199)
HDL: 39 mg/dL — ABNORMAL LOW (ref >39)
LDL Chol Calc (NIH): 119 mg/dL — ABNORMAL HIGH (ref 0–99)
Triglycerides: 155 mg/dL — ABNORMAL HIGH (ref 0–149)
VLDL Cholesterol Cal: 28 mg/dL (ref 5–40)

## 2023-11-12 ENCOUNTER — Other Ambulatory Visit: Payer: Self-pay

## 2023-11-12 ENCOUNTER — Telehealth: Payer: Self-pay

## 2023-11-12 NOTE — Telephone Encounter (Signed)
 Pharmacy Patient Advocate Encounter   Received notification from CoverMyMeds that prior authorization for OZEMPIC  is required/requested.   Insurance verification completed.   The patient is insured through [facility] [place] .   Per test claim: PA required; PA submitted to above mentioned insurance via CoverMyMeds Key/confirmation #/EOC [id number] Status is pending

## 2023-11-26 ENCOUNTER — Telehealth: Payer: Self-pay

## 2023-11-26 NOTE — Telephone Encounter (Signed)
 Please result   Copied from CRM #[id number]. Topic: Clinical - Lab/Test Results >> Nov 26, 2023  9:39 AM Tobias CROME wrote: Reason for CRM: Patient inquiring about lab results from 11/09/23. Patient states she still has pain in her kidney area. Patient requesting for message to go to [facility] and PCP.

## 2023-11-27 NOTE — Telephone Encounter (Signed)
 Pt scheduled

## 2023-11-29 ENCOUNTER — Encounter: Payer: Self-pay | Admitting: Family Medicine

## 2023-11-29 ENCOUNTER — Ambulatory Visit (INDEPENDENT_AMBULATORY_CARE_PROVIDER_SITE_OTHER): Admitting: Family Medicine

## 2023-11-29 VITALS — BP 120/76 | HR 63 | Ht 62.0 in | Wt 208.8 lb

## 2023-11-29 DIAGNOSIS — R109 Unspecified abdominal pain: Secondary | ICD-10-CM

## 2023-11-29 DIAGNOSIS — G8929 Other chronic pain: Secondary | ICD-10-CM | POA: Diagnosis not present

## 2023-11-29 NOTE — Progress Notes (Signed)
 Established Patient Office Visit  Subjective    Patient ID: Caitlin Mejia, female    DOB: 1985/04/12  Age: 38 y.o. MRN: [id number]  CC:  Chief Complaint  Patient presents with   Flank Pain    Due for diabetic eye exam also    HPI Caitlin Mejia presents with complaint of kidney pain. Patient reports persistent right sided flank pain. Denies fever/chills. Denies GU sx.   Outpatient Encounter Medications as of 11/29/2023  Medication Sig   Accu-Chek Softclix Lancets lancets Use to check blood sugar three times daily. E11.65   Blood Glucose Monitoring Suppl (ACCU-CHEK GUIDE) w/Device KIT Use to check blood sugar three times daily. E11.65   glucose blood (ACCU-CHEK GUIDE TEST) test strip Use to check blood sugar three times daily. E11.65   Semaglutide ,0.25 or 0.5MG /DOS, (OZEMPIC , 0.25 OR 0.5 MG/DOSE,) 2 MG/3ML SOPN Inject 0.5 mg into the skin once a week.   atorvastatin  (LIPITOR) 40 MG tablet TAKE 1 TABLET(40 MG) BY MOUTH DAILY. PLEASE MAKE PCP APPOINTMENT FOR ADDITIONAL REFILLS (Patient not taking: Reported on 11/29/2023)   cefadroxil  (DURICEF) 500 MG capsule Take 1 capsule (500 mg total) by mouth 2 (two) times daily. (Patient not taking: Reported on 11/29/2023)   metFORMIN  (GLUCOPHAGE -XR) 500 MG 24 hr tablet TAKE 1 TABLET(500 MG) BY MOUTH TWICE DAILY WITH A MEAL (Patient not taking: Reported on 11/29/2023)   Semaglutide , 1 MG/DOSE, 4 MG/3ML SOPN Inject 1 mg as directed once a week.   No facility-administered encounter medications on file as of 11/29/2023.    Past Medical History:  Diagnosis Date   Diabetes mellitus without complication (HCC)    Gall stones    Kidney stones     Past Surgical History:  Procedure Laterality Date   ORIF ANKLE FRACTURE Left 04/06/2020   Procedure: OPEN REDUCTION INTERNAL FIXATION (ORIF) DISTAL FIBULAR  FRACTURE;  Surgeon: Josefina Chew, MD;  Location: [facility];  Service: Orthopedics;  Laterality: Left;   URETERAL STENT  PLACEMENT     wisdom teeth      Family History  Problem Relation Age of Onset   Healthy Mother    Cerebral aneurysm Father    Diabetes Paternal Uncle     Social History   Socioeconomic History   Marital status: Single    Spouse name: Not on file   Number of children: Not on file   Years of education: Not on file   Highest education level: Not on file  Occupational History   Not on file  Tobacco Use   Smoking status: Never   Smokeless tobacco: Never  Substance and Sexual Activity   Alcohol use: Yes    Comment: Rarely   Drug use: No   Sexual activity: Yes    Birth control/protection: None  Other Topics Concern   Not on file  Social History Narrative   Not on file   Social Drivers of Health   Financial Resource Strain: Low Risk  (12/08/2022)   Overall Financial Resource Strain (CARDIA)    Difficulty of Paying Living Expenses: Not hard at all  Food Insecurity: No Food Insecurity (12/08/2022)   Hunger Vital Sign    Worried About Running Out of Food in the Last Year: Never true    Ran Out of Food in the Last Year: Never true  Transportation Needs: No Transportation Needs (12/08/2022)   PRAPARE - Administrator, Civil Service (Medical): No    Lack of Transportation (Non-Medical): No  Physical Activity:  Inactive (12/08/2022)   Exercise Vital Sign    Days of Exercise per Week: 0 days    Minutes of Exercise per Session: 0 min  Stress: No Stress Concern Present (12/08/2022)   [name] of Occupational Health - Occupational Stress Questionnaire    Feeling of Stress : Not at all  Social Connections: Moderately Isolated (12/08/2022)   Social Connection and Isolation Panel    Frequency of Communication with Friends and Family: More than three times a week    Frequency of Social Gatherings with Friends and Family: More than three times a week    Attends Religious Services: Never    Database administrator or Organizations: No    Attends Banker  Meetings: Never    Marital Status: Married  Catering manager Violence: Not At Risk (12/08/2022)   Humiliation, Afraid, Rape, and Kick questionnaire    Fear of Current or Ex-Partner: No    Emotionally Abused: No    Physically Abused: No    Sexually Abused: No    Review of Systems  Genitourinary:  Positive for flank pain. Negative for dysuria, frequency, hematuria and urgency.  All other systems reviewed and are negative.       Objective    BP 120/76   Pulse 63   Ht 5' 2 (1.575 m)   Wt 208 lb 12.8 oz (94.7 kg)   LMP 11/08/2023 (Approximate)   SpO2 98%   Breastfeeding No   BMI 38.19 kg/m   Physical Exam Vitals and nursing note reviewed.  Constitutional:      General: She is not in acute distress. Cardiovascular:     Rate and Rhythm: Normal rate and regular rhythm.  Pulmonary:     Effort: Pulmonary effort is normal.     Breath sounds: Normal breath sounds.  Abdominal:     Palpations: Abdomen is soft.     Tenderness: There is right CVA tenderness.  Neurological:     General: No focal deficit present.     Mental Status: She is alert and oriented to person, place, and time.         Assessment & Plan:   Flank pain, chronic -     US  RENAL; Future     No follow-ups on file.   Tanda Raguel SQUIBB, MD

## 2023-12-08 ENCOUNTER — Ambulatory Visit (HOSPITAL_COMMUNITY)
Admission: RE | Admit: 2023-12-08 | Discharge: 2023-12-08 | Disposition: A | Discharge status: Home / Self Care | Source: Ambulatory Visit | Attending: Family Medicine | Admitting: Family Medicine

## 2023-12-08 DIAGNOSIS — R109 Unspecified abdominal pain: Secondary | ICD-10-CM | POA: Insufficient documentation

## 2023-12-08 DIAGNOSIS — G8929 Other chronic pain: Secondary | ICD-10-CM | POA: Insufficient documentation

## 2023-12-14 ENCOUNTER — Ambulatory Visit: Payer: Self-pay | Admitting: Family Medicine

## 2023-12-14 NOTE — Progress Notes (Signed)
 Called pt and gave results. She expressed understanding. Pt stated she has appt on 10/8 with Dr. Tanda and will discuss what to do then

## 2023-12-19 ENCOUNTER — Telehealth: Payer: Self-pay | Admitting: Family Medicine

## 2023-12-19 NOTE — Telephone Encounter (Signed)
 Confirmed appt for 10/3

## 2023-12-21 ENCOUNTER — Encounter: Payer: Self-pay | Admitting: Pharmacist

## 2023-12-21 ENCOUNTER — Ambulatory Visit: Attending: Family Medicine | Admitting: Pharmacist

## 2023-12-21 DIAGNOSIS — Z7984 Long term (current) use of oral hypoglycemic drugs: Secondary | ICD-10-CM

## 2023-12-21 DIAGNOSIS — E1165 Type 2 diabetes mellitus with hyperglycemia: Secondary | ICD-10-CM | POA: Diagnosis not present

## 2023-12-21 DIAGNOSIS — Z7985 Long-term (current) use of injectable non-insulin antidiabetic drugs: Secondary | ICD-10-CM

## 2023-12-21 MED ORDER — OZEMPIC (0.25 OR 0.5 MG/DOSE) 2 MG/3ML ~~LOC~~ SOPN: 0.5000 mg | PEN_INJECTOR | SUBCUTANEOUS | 0 refills | Status: DC

## 2023-12-21 MED ORDER — SEMAGLUTIDE (1 MG/DOSE) 4 MG/3ML ~~LOC~~ SOPN: 1.0000 mg | PEN_INJECTOR | SUBCUTANEOUS | 2 refills | Status: DC

## 2023-12-21 NOTE — Progress Notes (Signed)
    S:    No chief complaint on file.  38 y.o. female who presents for diabetes evaluation, education, and management. She was last seen and referred by her PCP, Dr. Tanda, on 11/29/2023. She was evaluated at that visit for flank pain. Renal US  showed kidney stones (x2, non-obstructive, ~60mm in size).  Last seen by me on 11/09/2023. We continued Ozempic  at that visit.   Today, patient reports that she is taking both the Ozempic  and metformin  as prescribed.  However, shortly after her last visit with me, she stopped the Ozempic  to see if her flank pain would resolve. Her pain persisted after stopping Ozempic . She is agreeable to restarting Ozempic  since her US  showed kidney stones as the cause of her flank pain. She has not been checking her blood sugars. Regarding hyperglycemia, she denies any changes in neuropathy (nerve pain). Denies any visual changes.  Current diabetes medications include: metformin  500 mg XR BID, Ozempic  0.5 mg weekly (not taking)  Insurance coverage: Medicaid   Family/Social History:  -Never smoker -No alcohol use reported  -Father: cerebral aneurysm  Patient denies hypoglycemic events  Reported home fasting blood sugars: none given  O:   Lab Results  Component Value Date   HGBA1C 7.0 11/09/2023   There were no vitals filed for this visit.  Lipid Panel     Component Value Date/Time   CHOL 186 11/09/2023 1502   TRIG 155 (H) 11/09/2023 1502   HDL 39 (L) 11/09/2023 1502   CHOLHDL 4.8 (H) 11/09/2023 1502   LDLCALC 119 (H) 11/09/2023 1502    Clinical Atherosclerotic Cardiovascular Disease (ASCVD): No  The ASCVD Risk score (Arnett DK, et al., 2019) failed to calculate for the following reasons:   The 2019 ASCVD risk score is only valid for ages 66 to 4   A/P: Diabetes longstanding currently at goal based on A1c 11/09/2023. Patient is asymptomatic from a hyper- and hypoglycemia standpoint. Patient is able to verbalize appropriate hypoglycemia management  plan but is not currently hypoglycemic. We will resume Ozempic  and metformin . Pt is agreeable to doing so.  -Restart Ozempic  0.5 mg weekly. -Restart metformin  500 mg ER BID  -Patient educated on purpose, proper use, and potential adverse effects of metformin  and Ozempic .  -Extensively discussed pathophysiology of diabetes, recommended lifestyle interventions, dietary effects on blood sugar control.  -Counseled on s/sx of and management of hypoglycemia.  -Next A1c anticipated 01/2024  Written patient instructions provided. Patient verbalized understanding of treatment plan.  Total time in face to face counseling 30 minutes.    Follow-up:  Pharmacist: in 1 month PCP: 12/26/2023  Herlene Fleeta Morris, PharmD, BCACP, CPP Clinical Pharmacist [facility] & [facility] [phone]

## 2023-12-26 ENCOUNTER — Encounter: Payer: Self-pay | Admitting: Family Medicine

## 2023-12-26 ENCOUNTER — Ambulatory Visit (INDEPENDENT_AMBULATORY_CARE_PROVIDER_SITE_OTHER): Admitting: Family Medicine

## 2023-12-26 VITALS — BP 113/76 | HR 72 | Ht 62.0 in | Wt 207.4 lb

## 2023-12-26 DIAGNOSIS — Z6837 Body mass index (BMI) 37.0-37.9, adult: Secondary | ICD-10-CM

## 2023-12-26 DIAGNOSIS — E66812 Obesity, class 2: Secondary | ICD-10-CM

## 2023-12-26 DIAGNOSIS — N200 Calculus of kidney: Secondary | ICD-10-CM

## 2023-12-26 NOTE — Progress Notes (Signed)
 Established Patient Office Visit  Subjective    Patient ID: Caitlin Mejia, female    DOB: [birth year]  Age: 38 y.o. MRN: [id number]  CC:  Chief Complaint  Patient presents with   Medical Management of Chronic Issues    2 month follow up - would like to discuss scheduling appt for her kidney stones     HPI Caitlin Mejia presents for referral regarding kidney stones. She reports that they are too large to pass on their own and they are causing her quite a bit of discomfort.   Outpatient Encounter Medications as of 12/26/2023  Medication Sig   Accu-Chek Softclix Lancets lancets Use to check blood sugar three times daily. E11.65   atorvastatin  (LIPITOR) 40 MG tablet TAKE 1 TABLET(40 MG) BY MOUTH DAILY. PLEASE MAKE PCP APPOINTMENT FOR ADDITIONAL REFILLS (Patient not taking: Reported on 11/29/2023)   Blood Glucose Monitoring Suppl (ACCU-CHEK GUIDE) w/Device KIT Use to check blood sugar three times daily. E11.65   cefadroxil  (DURICEF) 500 MG capsule Take 1 capsule (500 mg total) by mouth 2 (two) times daily. (Patient not taking: Reported on 11/29/2023)   glucose blood (ACCU-CHEK GUIDE TEST) test strip Use to check blood sugar three times daily. E11.65   metFORMIN  (GLUCOPHAGE -XR) 500 MG 24 hr tablet TAKE 1 TABLET(500 MG) BY MOUTH TWICE DAILY WITH A MEAL (Patient not taking: Reported on 11/29/2023)   Semaglutide , 1 MG/DOSE, 4 MG/3ML SOPN Inject 1 mg as directed once a week.   Semaglutide ,0.25 or 0.5MG /DOS, (OZEMPIC , 0.25 OR 0.5 MG/DOSE,) 2 MG/3ML SOPN Inject 0.5 mg into the skin once a week.   No facility-administered encounter medications on file as of 12/26/2023.    Past Medical History:  Diagnosis Date   Diabetes mellitus without complication (HCC)    Gall stones    Kidney stones     Past Surgical History:  Procedure Laterality Date   ORIF ANKLE FRACTURE Left 04/06/2020   Procedure: OPEN REDUCTION INTERNAL FIXATION (ORIF) DISTAL FIBULAR  FRACTURE;  Surgeon: Josefina Chew,  MD;  Location: [facility];  Service: Orthopedics;  Laterality: Left;   URETERAL STENT PLACEMENT     wisdom teeth      Family History  Problem Relation Age of Onset   Healthy Mother    Cerebral aneurysm Father    Diabetes Paternal Uncle     Social History   Socioeconomic History   Marital status: Single    Spouse name: Not on file   Number of children: Not on file   Years of education: Not on file   Highest education level: Not on file  Occupational History   Not on file  Tobacco Use   Smoking status: Never   Smokeless tobacco: Never  Substance and Sexual Activity   Alcohol use: Yes    Comment: Rarely   Drug use: No   Sexual activity: Yes    Birth control/protection: None  Other Topics Concern   Not on file  Social History Narrative   Not on file   Social Drivers of Health   Financial Resource Strain: Low Risk  (12/08/2022)   Overall Financial Resource Strain (CARDIA)    Difficulty of Paying Living Expenses: Not hard at all  Food Insecurity: No Food Insecurity (12/08/2022)   Hunger Vital Sign    Worried About Running Out of Food in the Last Year: Never true    Ran Out of Food in the Last Year: Never true  Transportation Needs: No Transportation Needs (12/08/2022)  PRAPARE - Administrator, Civil Service (Medical): No    Lack of Transportation (Non-Medical): No  Physical Activity: Inactive (12/08/2022)   Exercise Vital Sign    Days of Exercise per Week: 0 days    Minutes of Exercise per Session: 0 min  Stress: No Stress Concern Present (12/08/2022)   [name] of Occupational Health - Occupational Stress Questionnaire    Feeling of Stress : Not at all  Social Connections: Moderately Isolated (12/08/2022)   Social Connection and Isolation Panel    Frequency of Communication with Friends and Family: More than three times a week    Frequency of Social Gatherings with Friends and Family: More than three times a week    Attends  Religious Services: Never    Database administrator or Organizations: No    Attends Banker Meetings: Never    Marital Status: Married  Catering manager Violence: Not At Risk (12/08/2022)   Humiliation, Afraid, Rape, and Kick questionnaire    Fear of Current or Ex-Partner: No    Emotionally Abused: No    Physically Abused: No    Sexually Abused: No    Review of Systems  All other systems reviewed and are negative.       Objective    BP 113/76   Pulse 72   Ht 5' 2 (1.575 m)   Wt 207 lb 6.4 oz (94.1 kg)   LMP 11/08/2023 (Approximate)   SpO2 99%   BMI 37.93 kg/m   Physical Exam Vitals and nursing note reviewed.  Constitutional:      General: She is not in acute distress. Cardiovascular:     Rate and Rhythm: Normal rate and regular rhythm.  Pulmonary:     Effort: Pulmonary effort is normal.     Breath sounds: Normal breath sounds.  Abdominal:     Palpations: Abdomen is soft.     Tenderness: There is right CVA tenderness.  Neurological:     General: No focal deficit present.     Mental Status: She is alert and oriented to person, place, and time.         Assessment & Plan:   Kidney stones -     Ambulatory referral to Urology  Class 2 severe obesity due to excess calories with serious comorbidity and body mass index (BMI) of 37.0 to 37.9 in adult     No follow-ups on file.   Tanda Raguel SQUIBB, MD

## 2024-01-01 ENCOUNTER — Encounter: Payer: Self-pay | Admitting: Family Medicine

## 2024-01-11 ENCOUNTER — Ambulatory Visit (INDEPENDENT_AMBULATORY_CARE_PROVIDER_SITE_OTHER): Admitting: Urology

## 2024-01-11 ENCOUNTER — Encounter: Payer: Self-pay | Admitting: Urology

## 2024-01-11 VITALS — BP 114/78 | HR 89 | Ht 62.0 in | Wt 204.0 lb

## 2024-01-11 DIAGNOSIS — N200 Calculus of kidney: Secondary | ICD-10-CM | POA: Diagnosis not present

## 2024-01-11 DIAGNOSIS — R10A1 Flank pain, right side: Secondary | ICD-10-CM | POA: Diagnosis not present

## 2024-01-11 DIAGNOSIS — R8290 Unspecified abnormal findings in urine: Secondary | ICD-10-CM

## 2024-01-11 LAB — URINALYSIS, ROUTINE W REFLEX MICROSCOPIC
Bilirubin, UA: NEGATIVE
Ketones, UA: NEGATIVE
Nitrite, UA: NEGATIVE
Specific Gravity, UA: 1.015 (ref 1.005–1.030)
Urobilinogen, Ur: 0.2 mg/dL (ref 0.2–1.0)
pH, UA: 7 (ref 5.0–7.5)

## 2024-01-11 LAB — MICROSCOPIC EXAMINATION: WBC, UA: >30 /HPF — AB (ref 0–5)

## 2024-01-11 MED ORDER — SULFAMETHOXAZOLE-TRIMETHOPRIM 800-160 MG PO TABS: 1.0000 | ORAL_TABLET | Freq: Two times a day (BID) | ORAL | 0 refills | Status: AC

## 2024-01-11 NOTE — Progress Notes (Signed)
 Assessment: 1. Nephrolithiasis, left   2. Right flank pain   3. Abnormal urine findings     Plan: I personally reviewed the patient's chart including provider notes, lab and imaging results.  I personally reviewed her CT study from 11/22 and the renal ultrasound from 9/25. I do not have a clear urologic reason for her right sided flank pain as her stones are clearly on the left side and are nonobstructing.  I discussed further evaluation with CT imaging. Urine culture sent today. Bactrim DS twice daily x 5 days.  Prescription sent. Schedule for CT renal stone study-will call with results  Chief Complaint:  Chief Complaint  Patient presents with   Nephrolithiasis    History of Present Illness:  Caitlin Mejia is a 38 y.o. female who is seen in consultation from Tanda Bleacher, MD for evaluation of nephrolithiasis.   She reports a 17-month history of right sided flank/low back pain.  The pain is not worsened with movement or position.  No alleviating factors.  She reports the pain as dull and fairly constant.  No left-sided flank pain.  She is having some intermittent dysuria.  No gross hematuria.  She does report nocturia as well.  She has a prior history of UTIs with her last UTI approximately 1 month ago.  CT renal stone study from 12/21 showed a 6 mm calculus in the lower pole of the left kidney, malrotated right kidney, no hydronephrosis noted. CT renal stone study from 11/22 showed a malrotated right kidney, 7 mm left lower pole renal calculus without obstruction. Renal ultrasound from 9/25 showed a externally rotated right kidney, no right renal calculi or hydronephrosis, 2 nonobstructive calculi lower pole left kidney measuring up to 8 mm, no hydronephrosis.  She has a history of nephrolithiasis.  She has previously undergone 2 shockwave lithotripsy procedures and 1 ureteroscopy.   Past Medical History:  Past Medical History:  Diagnosis Date   Diabetes mellitus  without complication (HCC)    Gall stones    Kidney stones     Past Surgical History:  Past Surgical History:  Procedure Laterality Date   ORIF ANKLE FRACTURE Left 04/06/2020   Procedure: OPEN REDUCTION INTERNAL FIXATION (ORIF) DISTAL FIBULAR  FRACTURE;  Surgeon: Josefina Chew, MD;  Location: [facility];  Service: Orthopedics;  Laterality: Left;   URETERAL STENT PLACEMENT     wisdom teeth      Allergies:  Allergies  Allergen Reactions   Trulicity  [Dulaglutide ] Nausea And Vomiting    With the 1.5 mg weekly dose    Family History:  Family History  Problem Relation Age of Onset   Healthy Mother    Cerebral aneurysm Father    Diabetes Paternal Uncle     Social History:  Social History   Tobacco Use   Smoking status: Never   Smokeless tobacco: Never  Substance Use Topics   Alcohol use: Yes    Comment: Rarely   Drug use: No    Review of symptoms:  Constitutional:  Negative for unexplained weight loss, night sweats, fever, chills ENT:  Negative for nose bleeds, sinus pain, painful swallowing CV:  Negative for chest pain, shortness of breath, exercise intolerance, palpitations, loss of consciousness Resp:  Negative for cough, wheezing, shortness of breath GI:  Negative for nausea, vomiting, diarrhea, bloody stools GU:  Positives noted in HPI; otherwise negative for gross hematuria, dysuria, urinary incontinence Neuro:  Negative for seizures, poor balance, limb weakness, slurred speech Psych:  Negative  for lack of energy, depression, anxiety Endocrine:  Negative for polydipsia, polyuria, symptoms of hypoglycemia (dizziness, hunger, sweating) Hematologic:  Negative for anemia, purpura, petechia, prolonged or excessive bleeding, use of anticoagulants  Allergic:  Negative for difficulty breathing or choking as a result of exposure to anything; no shellfish allergy; no allergic response (rash/itch) to materials, foods  Physical exam: BP 114/78   Pulse 89    Ht 5' 2 (1.575 m)   Wt 204 lb (92.5 kg)   BMI 37.31 kg/m  GENERAL APPEARANCE:  Well appearing, well developed, well nourished, NAD HEENT: Atraumatic, Normocephalic, oropharynx clear. NECK: Supple without lymphadenopathy or thyromegaly. LUNGS: Clear to auscultation bilaterally. HEART: Regular Rate and Rhythm without murmurs, gallops, or rubs. ABDOMEN: Soft, non-tender, No Masses. EXTREMITIES: Moves all extremities well.  Without clubbing, cyanosis, or edema. NEUROLOGIC:  Alert and oriented x 3, normal gait, CN II-XII grossly intact.  MENTAL STATUS:  Appropriate. BACK:  Non-tender to palpation.  No CVAT SKIN:  Warm, dry and intact.    Results: U/A: >30 WBC, 3-10 RBC, mod bacteria

## 2024-01-15 ENCOUNTER — Ambulatory Visit (HOSPITAL_BASED_OUTPATIENT_CLINIC_OR_DEPARTMENT_OTHER)
Admission: RE | Admit: 2024-01-15 | Discharge: 2024-01-15 | Disposition: A | Discharge status: Home / Self Care | Source: Ambulatory Visit | Attending: Urology | Admitting: Urology

## 2024-01-15 ENCOUNTER — Ambulatory Visit: Payer: Self-pay | Admitting: Urology

## 2024-01-15 DIAGNOSIS — R10A1 Flank pain, right side: Secondary | ICD-10-CM | POA: Diagnosis present

## 2024-01-15 DIAGNOSIS — N200 Calculus of kidney: Secondary | ICD-10-CM | POA: Diagnosis present

## 2024-01-15 DIAGNOSIS — R8290 Unspecified abnormal findings in urine: Secondary | ICD-10-CM

## 2024-01-15 LAB — URINE CULTURE

## 2024-01-15 MED ORDER — NITROFURANTOIN MONOHYD MACRO 100 MG PO CAPS: 100.0000 mg | ORAL_CAPSULE | Freq: Two times a day (BID) | ORAL | 0 refills | Status: AC

## 2024-01-25 ENCOUNTER — Encounter: Payer: Self-pay | Admitting: Pharmacist

## 2024-01-25 ENCOUNTER — Ambulatory Visit: Attending: Family Medicine | Admitting: Pharmacist

## 2024-01-25 DIAGNOSIS — Z7985 Long-term (current) use of injectable non-insulin antidiabetic drugs: Secondary | ICD-10-CM

## 2024-01-25 DIAGNOSIS — E1165 Type 2 diabetes mellitus with hyperglycemia: Secondary | ICD-10-CM

## 2024-01-25 LAB — POCT GLYCOSYLATED HEMOGLOBIN (HGB A1C): HbA1c, POC (controlled diabetic range): 7.5 % — AB (ref 0.0–7.0)

## 2024-01-25 MED ORDER — SEMAGLUTIDE (1 MG/DOSE) 4 MG/3ML ~~LOC~~ SOPN: 1.0000 mg | PEN_INJECTOR | SUBCUTANEOUS | 2 refills | Status: DC

## 2024-01-25 NOTE — Progress Notes (Signed)
    S:    No chief complaint on file.  38 y.o. female who presents for diabetes evaluation, education, and management. She was last seen and referred by her PCP, Dr. Tanda, on 12/26/2023.   Last seen by me on 12/21/23. We continued Ozempic  at that visit.   Today, patient reports that she is taking the Ozempic . Tolerating well with no NV, abdominal pain, or changes in vision. Admits that she has not been taking the metformin  for a couple of months. She has not been checking her blood sugars at home but her A1c today is 7.5%. Regarding hyperglycemia, she denies any changes in neuropathy (nerve pain).   Current diabetes medications include: metformin  500 mg XR BID (not taking), Ozempic  0.5 mg weekly  Insurance coverage: Medicaid   Family/Social History:  -Never smoker -No alcohol use reported  -Father: cerebral aneurysm  Patient denies hypoglycemic events  Reported home fasting blood sugars: none given  O:  Lab Results  Component Value Date   HGBA1C 7.5 (A) 01/25/2024   There were no vitals filed for this visit.  Lipid Panel     Component Value Date/Time   CHOL 186 11/09/2023 1502   TRIG 155 (H) 11/09/2023 1502   HDL 39 (L) 11/09/2023 1502   CHOLHDL 4.8 (H) 11/09/2023 1502   LDLCALC 119 (H) 11/09/2023 1502    Clinical Atherosclerotic Cardiovascular Disease (ASCVD): No  The ASCVD Risk score (Arnett DK, et al., 2019) failed to calculate for the following reasons:   The 2019 ASCVD risk score is only valid for ages 32 to 74   A/P: Diabetes longstanding currently above goal based on A1c today. Since she is non-adherent to metformin  we will stop this today and increase Ozempic  dose. Patient is asymptomatic from a hyper- and hypoglycemia standpoint. Patient is able to verbalize appropriate hypoglycemia management plan but is not currently hypoglycemic. -INCREASE Ozempic  to 1 mg weekly. -DISCONTINUE metformin  500 mg ER BID  -Patient educated on purpose, proper use, and potential  adverse effects of metformin  and Ozempic .  -Extensively discussed pathophysiology of diabetes, recommended lifestyle interventions, dietary effects on blood sugar control.  -Counseled on s/sx of and management of hypoglycemia.  -Next A1c anticipated 04/2024  Written patient instructions provided. Patient verbalized understanding of treatment plan.  Total time in face to face counseling 30 minutes.    Follow-up:  Pharmacist: in 1 month  Herlene Fleeta Morris, PharmD, [place], CPP Clinical Pharmacist [facility] [phone]

## 2024-02-29 ENCOUNTER — Ambulatory Visit: Admitting: Pharmacist

## 2024-03-11 ENCOUNTER — Telehealth: Payer: Self-pay | Admitting: Family Medicine

## 2024-03-11 ENCOUNTER — Other Ambulatory Visit: Payer: Self-pay | Admitting: Pharmacist

## 2024-03-11 MED ORDER — SEMAGLUTIDE (1 MG/DOSE) 4 MG/3ML ~~LOC~~ SOPN: 1.0000 mg | PEN_INJECTOR | SUBCUTANEOUS | 2 refills | Status: DC

## 2024-03-11 NOTE — Telephone Encounter (Addendum)
 Hi Caitlin Mejia,  Would it be possible for you to please send in the prescription refill for Semaglutide , 1 MG/DOSE, 4 MG.

## 2024-03-12 NOTE — Telephone Encounter (Signed)
 Thank you. Contacted patient and notified that the prescription has been sent.

## 2024-03-28 NOTE — Progress Notes (Unsigned)
" ° ° °  S:    No chief complaint on file.  39 y.o. female who presents for diabetes evaluation, education, and management. Caitlin Mejia was last seen and referred by her PCP, Dr. Tanda, on 12/26/2023.   At last visit on 01/25/2024, Ozempic  was increased to 1 mg weekly and metformin  was discontinued due to patient self-stopping it for several months.   At today's visit, ***  Tolerability of dose titration?  Home BG readings?  Hyperglycemia?  Current diabetes medications include:  Ozempic  1 mg weekly  Insurance coverage: Medicaid   Family/Social History:  Never smoker No alcohol use reported  Father: cerebral aneurysm  Patient denies hypoglycemic events. ***  Reported home fasting blood sugars: ***  O:  Lab Results  Component Value Date   HGBA1C 7.5 (A) 01/25/2024   There were no vitals filed for this visit.  Lipid Panel     Component Value Date/Time   CHOL 186 11/09/2023 1502   TRIG 155 (H) 11/09/2023 1502   HDL 39 (L) 11/09/2023 1502   CHOLHDL 4.8 (H) 11/09/2023 1502   LDLCALC 119 (H) 11/09/2023 1502    Clinical Atherosclerotic Cardiovascular Disease (ASCVD): No  The ASCVD Risk score (Arnett DK, et al., 2019) failed to calculate for the following reasons:   The 2019 ASCVD risk score is only valid for ages 42 to 51   A/P: Diabetes longstanding currently above goal based on A1c of 7.5% on 01/25/24. *** Patient is asymptomatic from a hyper- and hypoglycemia standpoint. Patient is able to verbalize appropriate hypoglycemia management plan but is not currently hypoglycemic. *** Patient educated on purpose, proper use, and potential adverse effects of Ozempic .  Extensively discussed pathophysiology of diabetes, recommended lifestyle interventions, dietary effects on blood sugar control.  Counseled on s/sx of and management of hypoglycemia.  Next A1c anticipated 04/2024  Written patient instructions provided. Patient verbalized understanding of treatment plan.   Total time in  face to face counseling 30 minutes.    Follow-up:  Pharmacist: ***  ***  Herlene Fleeta Morris, PharmD, BCACP, CPP Clinical Pharmacist [facility] [phone]  "

## 2024-03-31 ENCOUNTER — Encounter: Payer: Self-pay | Admitting: Pharmacist

## 2024-03-31 ENCOUNTER — Ambulatory Visit: Attending: Family Medicine | Admitting: Pharmacist

## 2024-03-31 DIAGNOSIS — Z7985 Long-term (current) use of injectable non-insulin antidiabetic drugs: Secondary | ICD-10-CM

## 2024-03-31 DIAGNOSIS — E1165 Type 2 diabetes mellitus with hyperglycemia: Secondary | ICD-10-CM

## 2024-03-31 MED ORDER — ATORVASTATIN CALCIUM 40 MG PO TABS: 40.0000 mg | ORAL_TABLET | Freq: Every day | ORAL | 3 refills | Status: AC

## 2024-03-31 MED ORDER — SEMAGLUTIDE (2 MG/DOSE) 8 MG/3ML ~~LOC~~ SOPN: 2.0000 mg | PEN_INJECTOR | SUBCUTANEOUS | 2 refills | Status: AC

## 2024-04-03 ENCOUNTER — Other Ambulatory Visit: Payer: Self-pay

## 2024-04-07 ENCOUNTER — Other Ambulatory Visit: Payer: Self-pay

## 2024-05-05 ENCOUNTER — Ambulatory Visit: Payer: Self-pay | Admitting: Pharmacist
